# Patient Record
Sex: Male | Born: 1955
Health system: Southern US, Community
[De-identification: ages and names within clinical notes are randomized; demographics above are authoritative.]

## PROBLEM LIST (undated history)

## (undated) DIAGNOSIS — E785 Hyperlipidemia, unspecified: Secondary | ICD-10-CM

## (undated) HISTORY — DX: Hyperlipidemia, unspecified: E78.5

## (undated) HISTORY — PX: APPENDECTOMY: SHX54

---

## 1999-10-25 ENCOUNTER — Encounter: Payer: Self-pay | Admitting: Surgery

## 1999-10-25 ENCOUNTER — Encounter: Admission: RE | Admit: 1999-10-25 | Discharge: 1999-10-25 | Payer: Self-pay | Admitting: Surgery

## 2000-05-29 ENCOUNTER — Encounter: Payer: Self-pay | Admitting: General Surgery

## 2000-05-29 ENCOUNTER — Inpatient Hospital Stay (HOSPITAL_COMMUNITY): Admission: EM | Admit: 2000-05-29 | Discharge: 2000-06-12 | Payer: Self-pay | Admitting: Emergency Medicine

## 2000-05-29 ENCOUNTER — Encounter: Payer: Self-pay | Admitting: Emergency Medicine

## 2000-05-30 ENCOUNTER — Encounter: Payer: Self-pay | Admitting: General Surgery

## 2000-06-25 ENCOUNTER — Emergency Department (HOSPITAL_COMMUNITY): Admission: EM | Admit: 2000-06-25 | Discharge: 2000-06-25 | Payer: Self-pay | Admitting: Emergency Medicine

## 2016-12-20 ENCOUNTER — Encounter (HOSPITAL_COMMUNITY): Payer: Self-pay | Admitting: Emergency Medicine

## 2016-12-20 ENCOUNTER — Ambulatory Visit (HOSPITAL_COMMUNITY)
Admission: EM | Admit: 2016-12-20 | Discharge: 2016-12-20 | Disposition: A | Discharge status: Home / Self Care | Payer: BLUE CROSS/BLUE SHIELD | Attending: Family Medicine | Admitting: Family Medicine

## 2016-12-20 DIAGNOSIS — M79672 Pain in left foot: Secondary | ICD-10-CM

## 2016-12-20 DIAGNOSIS — M79671 Pain in right foot: Secondary | ICD-10-CM

## 2016-12-20 MED ORDER — DICLOFENAC SODIUM 75 MG PO TBEC: 75.0000 mg | DELAYED_RELEASE_TABLET | Freq: Two times a day (BID) | ORAL | 0 refills | Status: DC

## 2016-12-20 NOTE — ED Triage Notes (Signed)
Pt. Stated, 4 weeks ago my left foot started hurting right at the foot and ankle come together, now its on both feet.

## 2016-12-20 NOTE — ED Provider Notes (Signed)
CSN: [id number]     Arrival date & time 12/20/16  1711 History   None    Chief Complaint  Patient presents with  . Foot Pain   (Consider location/radiation/quality/duration/timing/severity/associated sxs/prior Treatment) 61 year old male presents to clinic with chief complaint of bilateral foot pain. His pain in the left foot started 4 weeks ago and the pain in the right started 2 weeks ago. He states the pain is at the "top" of his foot, dull and achy, worse when bearing weight. Denies history of trauma, grout, or neuropathy.   The history is provided by the patient.  Foot Pain     History reviewed. No pertinent past medical history. History reviewed. No pertinent surgical history. No family history on file. Social History  Substance Use Topics  . Smoking status: Never Smoker  . Smokeless tobacco: Never Used  . Alcohol use No    Review of Systems  Reason unable to perform ROS: as covered in HPI.  All other systems reviewed and are negative.   Allergies  Patient has no allergy information on record.  Home Medications   Prior to Admission medications   Medication Sig Start Date End Date Taking? Authorizing Provider  diclofenac (VOLTAREN) 75 MG EC tablet Take 1 tablet (75 mg total) by mouth 2 (two) times daily. 12/20/16   Barnet Glasgow, NP   Meds Ordered and Administered this Visit  Medications - No data to display  BP 135/72 (BP Location: Right Arm)   Pulse 70   Temp 98.5 F (36.9 C) (Oral)   Resp 17   Ht 6' 2.5" (1.892 m)   Wt 240 lb (108.9 kg)   SpO2 99%   BMI 30.40 kg/m  No data found.   Physical Exam  Constitutional: He is oriented to person, place, and time. He appears well-developed and well-nourished. No distress.  Musculoskeletal:       Right ankle: He exhibits normal range of motion, no swelling and normal pulse. Tenderness.       Feet:  Neurological: He is alert and oriented to person, place, and time.  Skin: Skin is warm and dry. Capillary  refill takes less than 2 seconds. He is not diaphoretic.  Psychiatric: He has a normal mood and affect.  Nursing note and vitals reviewed.   Urgent Care Course     Procedures (including critical care time)  Labs Review Labs Reviewed - No data to display  Imaging Review No results found.   Visual Acuity Review  Right Eye Distance:   Left Eye Distance:   Bilateral Distance:    Right Eye Near:   Left Eye Near:    Bilateral Near:         MDM   1. Pain in both feet   For your pain, the symptoms are consistent with an over use injury such as tendonitis. I have prescribed an antiinflammatory medication called Diclofenac. Take this medicine twice a day. I advise rest, elevation, and ice for 15 minutes at a time 4 or more times a day. Should your symptoms fail to improve, I would recommend following up with an orthopedic doctor for further evaluation.     Barnet Glasgow, NP 12/20/16 [phone]

## 2016-12-20 NOTE — Discharge Instructions (Signed)
For your pain, the symptoms are consistent with an over use injury such as tendonitis. I have prescribed an antiinflammatory medication called Diclofenac. Take this medicine twice a day. I advise rest, elevation, and ice for 15 minutes at a time 4 or more times a day. Should your symptoms fail to improve, I would recommend following up with an orthopedic doctor for further evaluation.

## 2019-08-01 ENCOUNTER — Ambulatory Visit
Admission: EM | Admit: 2019-08-01 | Discharge: 2019-08-01 | Disposition: A | Discharge status: Home / Self Care | Payer: BC Managed Care – PPO | Attending: Physician Assistant | Admitting: Physician Assistant

## 2019-08-01 DIAGNOSIS — G8929 Other chronic pain: Secondary | ICD-10-CM | POA: Diagnosis not present

## 2019-08-01 DIAGNOSIS — M25561 Pain in right knee: Secondary | ICD-10-CM

## 2019-08-01 DIAGNOSIS — M25562 Pain in left knee: Secondary | ICD-10-CM

## 2019-08-01 MED ORDER — DICLOFENAC SODIUM 1 % TD GEL: 2.0000 g | Freq: Four times a day (QID) | TRANSDERMAL | 0 refills | Status: DC

## 2019-08-01 MED ORDER — MELOXICAM 7.5 MG PO TABS: 7.5000 mg | ORAL_TABLET | Freq: Every day | ORAL | 0 refills | Status: DC

## 2019-08-01 NOTE — ED Triage Notes (Signed)
Pt c/o bilateral knee pain for the past 3 1/2 months. States now they pop and get stiff.

## 2019-08-01 NOTE — Discharge Instructions (Signed)
Start Mobic. Do not take ibuprofen (motrin/advil)/ naproxen (aleve) while on mobic. You can use voltaren gel/tylenol as needed for additional pain relief. Ice compress, rest, knee sleeve during activity. If continues, follow up with sports medicine/orthopedics for further evaluation needed.

## 2019-08-01 NOTE — ED Provider Notes (Signed)
[facility]    CSN: [id number] Arrival date & time: 08/01/19  1109      History   Chief Complaint Chief Complaint  Patient presents with  . Knee Pain    HPI [facility] is a 63 y.o. male.   63 yo presents with bilateral knee pain x 3-4 months. He reports twisting his right knee 3-4 months ago and then twisting his left knee shortly after. He did not have knee pain prior to these injuries. He reports swelling following the injuries which has resolved. He now has intermittent pain. The pain generalized on the patellar aspect of both knees. He reports the pain was more significant yesterday with the rain. He reports stiffness in the morning that improves throughout the day and throbbing at rest. The pain is also worse when raising from a seated to standing position. He has tried aspirin, Bengay and heat with moderate relief.  Denies swelling, erythema, or fever. Denies numbness or tinging. He previous did work that required him to be on his knees frequently.      History reviewed. No pertinent past medical history.  There are no active problems to display for this patient.   Past Surgical History:  Procedure Laterality Date  . APPENDECTOMY         Home Medications    Prior to Admission medications   Medication Sig Start Date End Date Taking? Authorizing Provider  diclofenac sodium (VOLTAREN) 1 % GEL Apply 2 g topically 4 (four) times daily. 08/01/19   Tasia Catchings, Ardean Simonich V, PA-C  meloxicam (MOBIC) 7.5 MG tablet Take 1 tablet (7.5 mg total) by mouth daily. 08/01/19   Ok Edwards, PA-C    Family History No family history on file.  Social History Social History   Tobacco Use  . Smoking status: Never Smoker  . Smokeless tobacco: Never Used  Substance Use Topics  . Alcohol use: No  . Drug use: No     Allergies   Patient has no known allergies.   Review of Systems Review of Systems  See HPI.    Physical Exam Triage Vital Signs ED Triage Vitals  Enc Vitals  Group     BP 08/01/19 1124 140/85     Pulse Rate 08/01/19 1124 69     Resp 08/01/19 1124 18     Temp 08/01/19 1124 98.3 F (36.8 C)     Temp Source 08/01/19 1124 Oral     SpO2 08/01/19 1124 98 %     Weight --      Height --      Head Circumference --      Peak Flow --      Pain Score 08/01/19 1125 3     Pain Loc --      Pain Edu? --      Excl. in [place]? --    No data found.  Updated Vital Signs BP 140/85 (BP Location: Left Arm)   Pulse 69   Temp 98.3 F (36.8 C) (Oral)   Resp 18   SpO2 98%   Physical Exam Constitutional:      General: He is not in acute distress.    Appearance: Normal appearance. He is not ill-appearing, toxic-appearing or diaphoretic.  HENT:     Head: Normocephalic and atraumatic.  Pulmonary:     Effort: Pulmonary effort is normal. No respiratory distress.  Musculoskeletal:     Right knee: He exhibits normal range of motion, no swelling, no effusion, no deformity,  no erythema, normal alignment, no LCL laxity, normal patellar mobility, no bony tenderness and no MCL laxity. No tenderness found. No medial joint line, no lateral joint line, no MCL, no LCL and no patellar tendon tenderness noted.     Left knee: He exhibits normal range of motion, no swelling, no effusion, no deformity, no erythema, normal alignment, no LCL laxity, normal patellar mobility, no bony tenderness, normal meniscus and no MCL laxity. No tenderness found. No medial joint line, no lateral joint line, no MCL, no LCL and no patellar tendon tenderness noted.     Comments: Right knee crepitus noted. No crepitus noted of left knee.   Negative anterior/posterior drawer. No tenderness with varus/valgus stress  Skin:    General: Skin is warm and dry.  Neurological:     Mental Status: He is alert and oriented to person, place, and time. Mental status is at baseline.  Psychiatric:        Mood and Affect: Mood normal.        Behavior: Behavior normal.      UC Treatments / Results  Labs  (all labs ordered are listed, but only abnormal results are displayed) Labs Reviewed - No data to display  EKG   Radiology No results found.  Procedures Procedures (including critical care time)  Medications Ordered in UC Medications - No data to display  Initial Impression / Assessment and Plan / UC Course  I have reviewed the triage vital signs and the nursing notes.  Pertinent labs & imaging results that were available during my care of the patient were reviewed by me and considered in my medical decision making (see chart for details).   No indications for Xray today. Lower suspicion for septic arthritis based on history and exam. Mobic and voltaren gel given. Knee sleeves fitted today. Return precautions given. Discussed follow up with sports medicine or ortho for further evaluation if no improvement.    Final Clinical Impressions(s) / UC Diagnoses   Final diagnoses:  Chronic pain of both knees    ED Prescriptions    Medication Sig Dispense Auth. Provider   meloxicam (MOBIC) 7.5 MG tablet Take 1 tablet (7.5 mg total) by mouth daily. 10 tablet Tasia Catchings, Sirius Woodford V, PA-C   diclofenac sodium (VOLTAREN) 1 % GEL Apply 2 g topically 4 (four) times daily. 50 g Ok Edwards, PA-C     PDMP not reviewed this encounter.   Ok Edwards, PA-C 08/01/19 1457

## 2019-08-20 ENCOUNTER — Encounter: Payer: Self-pay | Admitting: Sports Medicine

## 2019-08-20 ENCOUNTER — Ambulatory Visit
Admission: RE | Admit: 2019-08-20 | Discharge: 2019-08-20 | Disposition: A | Discharge status: Home / Self Care | Payer: BC Managed Care – PPO | Source: Ambulatory Visit | Attending: Sports Medicine | Admitting: Sports Medicine

## 2019-08-20 ENCOUNTER — Other Ambulatory Visit: Payer: Self-pay

## 2019-08-20 ENCOUNTER — Ambulatory Visit: Payer: BC Managed Care – PPO | Admitting: Sports Medicine

## 2019-08-20 VITALS — BP 130/82 | Ht 74.5 in | Wt 220.0 lb

## 2019-08-20 DIAGNOSIS — G8929 Other chronic pain: Secondary | ICD-10-CM

## 2019-08-20 DIAGNOSIS — M25562 Pain in left knee: Secondary | ICD-10-CM | POA: Diagnosis not present

## 2019-08-20 DIAGNOSIS — M25561 Pain in right knee: Secondary | ICD-10-CM | POA: Diagnosis not present

## 2019-08-20 DIAGNOSIS — M25462 Effusion, left knee: Secondary | ICD-10-CM | POA: Diagnosis not present

## 2019-08-20 DIAGNOSIS — M25461 Effusion, right knee: Secondary | ICD-10-CM | POA: Diagnosis not present

## 2019-08-20 NOTE — Progress Notes (Addendum)
[facility] [address] [place], [place] [address] Phone: [phone] Fax: [phone]   Patient Name: [facility] Date of Birth: 1955-12-31 Medical Record Number: [id number] Gender: male Date of Encounter: 08/20/2019  CC: Bilateral knee pain  HPI: [facility] is a 63 year old gentleman presenting with bilateral knee pain.  He says it has been bothering him for years, but over the last 18 months has worsened.  He was seen at an urgent care last month and given knee braces, Mobic, and Voltaren gel that has helped.  He did have some GI upset with the meloxicam.  States the pain is not the most bothersome, but it is more the locking, catching, and instability he is feeling.  Feels it started in his left knee, but favored it so much that the right knee is starting to ache more.  He denies any radiating pain.  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  Most of his jobs in the past have involved a lot of being on his knees as well.  He is a regular bowler and has not been able to bowl during quarantine.  No past medical history on file.  Current Outpatient Medications on File Prior to Visit  Medication Sig Dispense Refill  . diclofenac sodium (VOLTAREN) 1 % GEL Apply 2 g topically 4 (four) times daily. 50 g 0  . meloxicam (MOBIC) 7.5 MG tablet Take 1 tablet (7.5 mg total) by mouth daily. 10 tablet 0   No current facility-administered medications on file prior to visit.     Past Surgical History:  Procedure Laterality Date  . APPENDECTOMY      No Known Allergies  Social History   Socioeconomic History  . Marital status: Married    Spouse name: Not on file  . Number of children: Not on file  . Years of education: Not on file  . Highest education level: Not on file  Occupational History  . Not on file  Social Needs  . Financial resource strain: Not on file  . Food insecurity   Worry: Not on file    Inability: Not on file  . Transportation needs    Medical: Not on file    Non-medical: Not on file  Tobacco Use  . Smoking status: Never Smoker  . Smokeless tobacco: Never Used  Substance and Sexual Activity  . Alcohol use: No  . Drug use: No  . Sexual activity: Not on file  Lifestyle  . Physical activity    Days per week: Not on file    Minutes per session: Not on file  . Stress: Not on file  Relationships  . Social Herbalist on phone: Not on file    Gets together: Not on file    Attends religious service: Not on file    Active member of club or organization: Not on file    Attends meetings of clubs or organizations: Not on file    Relationship status: Not on file  . Intimate partner violence    Fear of current or ex partner: Not on file    Emotionally abused: Not on file    Physically abused: Not on file    Forced sexual activity: Not on file  Other Topics Concern  . Not on file  Social History Narrative  . Not on file    No family history on file.  BP  130/82   Ht 6' 2.5" (1.892 m)   Wt 220 lb (99.8 kg)   BMI 27.87 kg/m   ROS:  See HPI CONST: no F/C, no malaise, no fatigue MSK: See above NEURO: no numbness/tingling, no weakness SKIN: no rash, no lesions HEME: no bleeding, no bruising, no erythema  Objective: GEN: Alert and oriented, NAD Pulm: Breathing unlabored PSY: normal mood, congruent affect  Left knee: Normal to inspection with no erythema or obvious bony abnormalities. Small suprapatellar effusion Palpation normal with no warmth, joint line tenderness, patellar tenderness, or condyle tenderness. ROM full in flexion, but unable to reach terminal knee extension Ligaments with solid consistent endpoints including ACL, PCL, LCL, MCL. Positive Mcmurray's and Thessaly tests. Non painful patellar compression. Patellar glide without crepitus. Patellar and quadriceps tendons unremarkable. Hamstring and quadriceps  strength is normal.  Neurovascularly intact.  Right knee: Normal to inspection with no erythema or effusion or obvious bony abnormalities. Palpation normal with no warmth, joint line tenderness, patellar tenderness, or condyle tenderness. ROM full in flexion but unable to reach terminal knee extension Ligaments with solid consistent endpoints including ACL, PCL, LCL, MCL. Positive Mcmurray's and Thessaly tests. Non painful patellar compression. Patellar glide without crepitus. Patellar and quadriceps tendons unremarkable. Hamstring and quadriceps strength is normal.  Neurovascularly intact.  MSK left Limited knee ultrasound performed,  Suprapatellar pouch visualized in long and short axis with  small effusion Quadriceps tendon visualized in both long and short axis without abnormality. Patellar Tendon is visualized in long and short axis without abnormality, but did note thickening Medial meniscus and MCL visualized with no abnormality Lateral mensicus and LCL visualized with no abnormality  MSK right Limited knee ultrasound performed,  Suprapatellar pouch visualized in long and short axis with no abnormality. Quadriceps tendon visualized in both long and short axis without abnormality. Patellar Tendon is visualized in long and short axis without abnormality MCL visualized with no abnormality Medial meniscus visualized with degenerative changes Lateral mensicus and LCL visualized with no abnormality Popliteal fossa was visualized, no Baker's cyst   IMPRESSION:  Left knee demonstrates suprapatellar effusion.  Right knee demonstrates degenerative changes to medial meniscus.   Assessment and Plan:  1.  Bilateral knee pain  Given the chronicity of the symptoms and physical exam findings today, I have ordered bilateral knee XR to evaluate for degree of osteoarthritis.  I am concerned he likely has a degenerative meniscus tear.  I will call him back with results this week.  I have made  a follow-up appointment for 1 week to perform CSI.  I have also given patient a prescription to start physical therapy.  Continue to wear knee brace when active.  Continue to use Voltaren gel.   Lanier Clam, DO, ATC Sports Medicine Fellow  Addendum:  Patient seen in the office by fellow.  His history, exam, plan of care were precepted with me.  Karlton Lemon MD Kirt Boys

## 2019-08-27 ENCOUNTER — Encounter: Payer: Self-pay | Admitting: Sports Medicine

## 2019-08-27 ENCOUNTER — Other Ambulatory Visit: Payer: Self-pay

## 2019-08-27 ENCOUNTER — Ambulatory Visit: Payer: BC Managed Care – PPO | Admitting: Sports Medicine

## 2019-08-27 VITALS — BP 128/84 | Ht 74.5 in | Wt 220.0 lb

## 2019-08-27 DIAGNOSIS — M25562 Pain in left knee: Secondary | ICD-10-CM

## 2019-08-27 DIAGNOSIS — G8929 Other chronic pain: Secondary | ICD-10-CM | POA: Diagnosis not present

## 2019-08-27 DIAGNOSIS — M25561 Pain in right knee: Secondary | ICD-10-CM

## 2019-08-27 MED ORDER — METHYLPREDNISOLONE ACETATE 40 MG/ML IJ SUSP
40.0000 mg | Freq: Once | INTRAMUSCULAR | Status: AC
  Administered 2019-08-27: 09:00:00 40 mg via INTRA_ARTICULAR

## 2019-08-27 MED ORDER — METHYLPREDNISOLONE ACETATE 40 MG/ML IJ SUSP
40.0000 mg | Freq: Once | INTRAMUSCULAR | Status: AC
  Administered 2019-08-27: 40 mg via INTRA_ARTICULAR

## 2019-08-27 NOTE — Progress Notes (Addendum)
[facility] [address] [place], [place] [address] Phone: [phone] Fax: [phone]   Patient Name: [facility] Date of Birth: November 29, 1955 Medical Record Number: [id number] Gender: male Date of Encounter: 08/27/2019  CC: Bilateral knee injections  HPI: [facility] presents today for bilateral knee injections  No past medical history on file.  Current Outpatient Medications on File Prior to Visit  Medication Sig Dispense Refill  . diclofenac sodium (VOLTAREN) 1 % GEL Apply 2 g topically 4 (four) times daily. 50 g 0  . meloxicam (MOBIC) 7.5 MG tablet Take 1 tablet (7.5 mg total) by mouth daily. (Patient not taking: Reported on 08/27/2019) 10 tablet 0   No current facility-administered medications on file prior to visit.     Past Surgical History:  Procedure Laterality Date  . APPENDECTOMY      No Known Allergies  Social History   Socioeconomic History  . Marital status: Married    Spouse name: Not on file  . Number of children: Not on file  . Years of education: Not on file  . Highest education level: Not on file  Occupational History  . Not on file  Social Needs  . Financial resource strain: Not on file  . Food insecurity    Worry: Not on file    Inability: Not on file  . Transportation needs    Medical: Not on file    Non-medical: Not on file  Tobacco Use  . Smoking status: Never Smoker  . Smokeless tobacco: Never Used  Substance and Sexual Activity  . Alcohol use: No  . Drug use: No  . Sexual activity: Not on file  Lifestyle  . Physical activity    Days per week: Not on file    Minutes per session: Not on file  . Stress: Not on file  Relationships  . Social Herbalist on phone: Not on file    Gets together: Not on file    Attends religious service: Not on file    Active member of club or organization: Not on file    Attends meetings of clubs or organizations: Not on file    Relationship status: Not  on file  . Intimate partner violence    Fear of current or ex partner: Not on file    Emotionally abused: Not on file    Physically abused: Not on file    Forced sexual activity: Not on file  Other Topics Concern  . Not on file  Social History Narrative  . Not on file    No family history on file.  BP 128/84   Ht 6' 2.5" (1.892 m)   Wt 220 lb (99.8 kg)   BMI 27.87 kg/m    Procedures After informed written consent timeout was performed, patient was seated on exam table. Right knee was prepped with alcohol swab and utilizing anterolateral approach, patient's right knee was injected intraarticularly with 3:1 bupivicaine: depomedrol. Patient tolerated the procedure well without immediate complications.  After informed written consent timeout was performed, patient was seated on exam table. Left knee was prepped with alcohol swab and utilizing anteromedial approach, patient's leftt knee was injected intraarticularly with 3:1 bupivicaine: depomedrol. Patient tolerated the procedure well without immediate complications.   Assessment and Plan:  1.  Bilateral knee osteoarthritis  Successful injections as above.  Patient was provided with HEP and OTC supplements.  Potential for repeating injections in 3 months versus gel injections in the future.  He will follow-up in 6 weeks or sooner if symptoms persist.   Lanier Clam, DO, ATC Sports Medicine Fellow  Addendum:  Patient seen in the office by fellow.  His history, exam, plan of care were precepted with me.  Clayton Lemon MD [facility]

## 2019-09-18 ENCOUNTER — Other Ambulatory Visit: Payer: Self-pay

## 2019-09-18 DIAGNOSIS — Z20822 Contact with and (suspected) exposure to covid-19: Secondary | ICD-10-CM

## 2019-09-19 LAB — NOVEL CORONAVIRUS, NAA: SARS-CoV-2, NAA: NOT DETECTED

## 2019-09-22 ENCOUNTER — Telehealth: Payer: Self-pay | Admitting: General Practice

## 2019-09-22 NOTE — Telephone Encounter (Signed)
Patient is calling to receive his negative COVID results. Patient expressed understanding. °

## 2021-04-19 ENCOUNTER — Ambulatory Visit (INDEPENDENT_AMBULATORY_CARE_PROVIDER_SITE_OTHER): Payer: BC Managed Care – PPO

## 2021-04-19 ENCOUNTER — Encounter: Payer: Self-pay | Admitting: Family Medicine

## 2021-04-19 ENCOUNTER — Other Ambulatory Visit: Payer: Self-pay

## 2021-04-19 ENCOUNTER — Ambulatory Visit: Payer: BC Managed Care – PPO | Admitting: Family Medicine

## 2021-04-19 ENCOUNTER — Ambulatory Visit: Payer: Self-pay

## 2021-04-19 VITALS — BP 120/78 | HR 79 | Ht 74.5 in | Wt 231.0 lb

## 2021-04-19 DIAGNOSIS — M25512 Pain in left shoulder: Secondary | ICD-10-CM

## 2021-04-19 DIAGNOSIS — G8929 Other chronic pain: Secondary | ICD-10-CM

## 2021-04-19 DIAGNOSIS — M11261 Other chondrocalcinosis, right knee: Secondary | ICD-10-CM | POA: Diagnosis not present

## 2021-04-19 DIAGNOSIS — M25561 Pain in right knee: Secondary | ICD-10-CM | POA: Diagnosis not present

## 2021-04-19 NOTE — Progress Notes (Signed)
Subjective:    CC: Bilateral knee pain, R >L  I, Clayton Kirby, LAT, ATC, am serving as scribe for Dr. Lynne Kirby.  HPI: Pt is a 65 y/o male complaining of bilat knee pain, R>L,  X 3 weeks w/ no new MOI. Pt was previously seen at [facility] in 2020 and received bilat knee steroid injections on 08/27/19. Pt locates pain to his R ant-med knee.  Knee swelling: No Mechanical symptoms: yes Aggravates: Driving; transitioning from sit-to-stand Treatments tried: OTC anti-inflammatories; knee brace; turmeric  Dx imaging: 08/20/19 R & L knee XR  Pt would also like to discuss his L shoulder that his bothering him x 3 months w/ no known MOI.  He notes that it does not hurt very much however he will occasionally get stuck in an abducted externally rotated position and wake from sleep.  This requires him to mechanically unlock his shoulder with some specific motion.  He is able to use his shoulder normally including bowling without a lot of dysfunction.  He is left-handed.   Pertinent review of Systems: No fevers or chills  Relevant historical information: History of multilevel spinal fusion.  Chondrocalcinosis  Objective:    Vitals:   04/19/21 1508  BP: 120/78  Pulse: 79  SpO2: 97%   General: Well Developed, well nourished, and in no acute distress.   MSK: Right knee mild effusion normal motion with crepitation.  Stable ligamentous exam intact strength.  Tender palpation medial joint line.  Left shoulder normal.  Nontender normal motion normal strength negative impingement testing.  Lab and Radiology Results  Procedure: Real-time Ultrasound Guided Injection of right knee superior lateral patellar space Device: Philips Affiniti 50G Images permanently stored and available for review in PACS Verbal informed consent obtained.  Discussed risks and benefits of procedure. Warned about infection bleeding damage to structures skin hypopigmentation and fat atrophy among  others. Patient expresses understanding and agreement Time-out conducted.   Noted no overlying erythema, induration, or other signs of local infection.   Skin prepped in a sterile fashion.   Local anesthesia: Topical Ethyl chloride.   With sterile technique and under real time ultrasound guidance:  40 mg of Kenalog and 2 mL of Marcaine injected into knee joint. Fluid seen entering the joint capsule.   Completed without difficulty   Pain immediately resolved suggesting accurate placement of the medication.   Advised to call if fevers/chills, erythema, induration, drainage, or persistent bleeding.   Images permanently stored and available for review in the ultrasound unit.  Impression: Technically successful ultrasound guided injection.   X-ray images left shoulder obtained today personally and independently interpreted Degenerative changes.  Multiple osteophytes left shoulder.  No acute fractures. Await formal radiology review   EXAM: RIGHT KNEE 3 VIEWS; LEFT KNEE 3 VIEWS  COMPARISON:  None.  FINDINGS: No fracture or dislocation of the bilateral knees. There is symmetric, moderate medial compartment joint space loss with minimal tricompartmental osteophytosis. There is bilateral chondrocalcinosis in keeping with CPPD. There are large, nonspecific bilateral knee joint effusions. There is extensive, symmetric enthesopathic change about the patellar poles and tibial tuberosities. Soft tissues are unremarkable.  IMPRESSION: 1. No fracture or dislocation of the bilateral knees.  2. There is symmetric, moderate medial compartment joint space loss with minimal tricompartmental osteophytosis.  3. There is bilateral chondrocalcinosis in keeping with CPPD (pseudogout).  4.  There are large, nonspecific bilateral knee joint effusions.  5. There is extensive, symmetric enthesopathic change about the patellar  poles and tibial tuberosities.   Electronically Signed   By:  Eddie Candle M.D.   On: 08/20/2019 15:12  I, Clayton Kirby, personally (independently) visualized and performed the interpretation of the images attached in this note.   Impression and Recommendations:    Assessment and Plan: 65 y.o. male with right knee pain.  Likely exacerbation of DJD.  Patient may have pseudogout based on chondrocalcinosis seen on x-ray 2020.  Plan for steroid injection today and Voltaren gel.  Recheck as needed.  Left shoulder mechanical locking.  This is been ongoing for about 3 months.  Patient is not particularly bothered by his symptoms.  I think the locking is probably due to the osteophyte seen on x-ray.  Ultimately the solution to his symptoms probably would require a total shoulder replacement or may be subacromial decompression and distal clavicle excision.  However because he is not bothered by his symptoms all that much and I do not think we really need to do very much at all aside from reassure him and proceed with some careful watchful waiting and perhaps home exercise program or physical therapy.Marland Kitchen  He is happy with this plan.  PDMP not reviewed this encounter. Orders Placed This Encounter  Procedures  . [place] LIMITED JOINT SPACE STRUCTURES LOW RIGHT(NO LINKED CHARGES)    Order Specific Question:   Reason for Exam (SYMPTOM  OR DIAGNOSIS REQUIRED)    Answer:   R knee pain    Order Specific Question:   Preferred imaging location?    Answer:   [place]  . DG Shoulder Left    Standing Status:   Future    Number of Occurrences:   1    Standing Expiration Date:   04/19/2022    Order Specific Question:   Reason for Exam (SYMPTOM  OR DIAGNOSIS REQUIRED)    Answer:   eval left shoulder locking    Order Specific Question:   Preferred imaging location?    Answer:   [facility]   No orders of the defined types were placed in this encounter.   Discussed warning signs or symptoms. Please see discharge instructions. Patient expresses  understanding.   The above documentation has been reviewed and is accurate and complete Clayton Kirby, M.D.

## 2021-04-19 NOTE — Patient Instructions (Signed)
Thank you for coming in today.  Call or go to the ER if you develop a large red swollen joint with extreme pain or oozing puss.   Please use Voltaren gel (Generic Diclofenac Gel) up to 4x daily for pain as needed.  This is available over-the-counter as both the name brand Voltaren gel and the generic diclofenac gel.  Please get an Xray today before you leave  Recheck with me as needed.

## 2021-04-21 NOTE — Progress Notes (Signed)
Left shoulder x-ray shows medium arthritis

## 2021-04-25 NOTE — Progress Notes (Signed)
Pt viewed Dr. Corey's result note via MyChart.  

## 2021-07-13 ENCOUNTER — Ambulatory Visit: Payer: BC Managed Care – PPO | Admitting: Family Medicine

## 2021-07-13 ENCOUNTER — Ambulatory Visit: Payer: Self-pay

## 2021-07-13 ENCOUNTER — Other Ambulatory Visit: Payer: Self-pay

## 2021-07-13 VITALS — BP 122/76 | HR 69 | Ht 74.5 in | Wt 232.2 lb

## 2021-07-13 DIAGNOSIS — G8929 Other chronic pain: Secondary | ICD-10-CM

## 2021-07-13 DIAGNOSIS — M25512 Pain in left shoulder: Secondary | ICD-10-CM

## 2021-07-13 NOTE — Progress Notes (Signed)
I, Peterson Lombard, LAT, ATC acting as a scribe for Lynne Leader, MD.  Clayton Kirby is a left-hand-dominant 65 y.o. male who presents to [place] at [facility] today for continued chronic L shoulder pain. Pt is L-hand dominate. Pt was last seen by Dr. Georgina Snell on 04/19/21 and was advised to plan on watchful waiting and the pain was not super bothersome at the time. Today, pt reports L shoulder flared up and worsened over the past 2 weeks. Pt locates pain to superior and lateral aspect of shoulder. Pt feels like the shoulder gets "locked" in place.  Mechanical symptoms: no UE numbness/tingling: no UE weakness: no Aggravates: ABD, overhead, ER Treatments tried: creams  Dx imaging: 04/19/21 L shoulder XR  Pertinent review of systems: No fevers or chills  Relevant historical information: History of chondrocalcinosis of the knee.   Exam:  BP 122/76   Pulse 69   Ht 6' 2.5" (1.892 m)   Wt 232 lb 3.2 oz (105.3 kg)   SpO2 97%   BMI 29.41 kg/m  General: Well Developed, well nourished, and in no acute distress.   MSK: Left shoulder normal-appearing Nontender. Range of motion abduction 100 degrees. Internal rotation lumbar spine. External rotation full. Strength 4+/5 abduction 5/5 external and internal rotation. Positive Hawkins and Neer's test. Negative Yergason's and speeds test. Pulses cap refill and sensation are intact distally.    Lab and Radiology Results  Procedure: Real-time Ultrasound Guided Injection of left shoulder subacromial bursa Device: Philips Affiniti 50G Images permanently stored and available for review in PACS Ultrasound evaluation prior to injection reveals intact rotator cuff tendons with moderate subacromial bursitis. Verbal informed consent obtained.  Discussed risks and benefits of procedure. Warned about infection bleeding damage to structures skin hypopigmentation and fat atrophy among others. Patient expresses understanding and  agreement Time-out conducted.   Noted no overlying erythema, induration, or other signs of local infection.   Skin prepped in a sterile fashion.   Local anesthesia: Topical Ethyl chloride.   With sterile technique and under real time ultrasound guidance: 40 mg of Kenalog and 2 mL of Marcaine injected into subacromial bursa fluid seen entering the bursa.   Completed without difficulty   Pain immediately resolved suggesting accurate placement of the medication.   Advised to call if fevers/chills, erythema, induration, drainage, or persistent bleeding.   Images permanently stored and available for review in the ultrasound unit.  Impression: Technically successful ultrasound guided injection.     Assessment and Plan: 65 y.o. male with left shoulder pain thought to be due to subacromial bursitis.  Plan for steroid injection subacromial bursa and referral to physical therapy.  Reviewed home exercise program in clinic today prior to discharge.  Recheck back in about 6 weeks.  Return sooner if needed.  Precautions reviewed.   PDMP not reviewed this encounter. Orders Placed This Encounter  Procedures   [place] LIMITED JOINT SPACE STRUCTURES UP LEFT(NO LINKED CHARGES)    Standing Status:   Future    Number of Occurrences:   1    Standing Expiration Date:   01/10/2022    Order Specific Question:   Reason for Exam (SYMPTOM  OR DIAGNOSIS REQUIRED)    Answer:   left shoulder pain    Order Specific Question:   Preferred imaging location?    Answer:   [place]   Ambulatory referral to Physical Therapy    Referral Priority:   Routine    Referral Type:  Physical Medicine    Referral Reason:   Specialty Services Required    Requested Specialty:   Physical Therapy    Number of Visits Requested:   1   No orders of the defined types were placed in this encounter.    Discussed warning signs or symptoms. Please see discharge instructions. Patient expresses  understanding.   .[email or web address]

## 2021-07-13 NOTE — Patient Instructions (Addendum)
Thank you for coming in today.   I've referred you to Physical Therapy.  Let us know if you don't hear from them in one week.   Call or go to the ER if you develop a large red swollen joint with extreme pain or oozing puss.    Recheck as needed or in 6 weeks.

## 2021-08-03 ENCOUNTER — Ambulatory Visit: Payer: BC Managed Care – PPO | Attending: Physical Therapy | Admitting: Physical Therapy

## 2021-08-11 ENCOUNTER — Ambulatory Visit: Payer: BC Managed Care – PPO

## 2021-08-11 NOTE — Progress Notes (Signed)
Subjective:    Clayton Kirby - 65 y.o. male MRN [id number]  Date of birth: 1956-07-13  HPI  Clayton Kirby is to establish care and annual physical exam.   Current issues and/or concerns: None   ROS per HPI   Health Maintenance:  Health Maintenance Due  Topic Date Due   Hepatitis C Screening  Never done   COLONOSCOPY (Pts 45-38yr Insurance coverage will need to be confirmed)  Never done     Past Medical History: Patient Active Problem List   Diagnosis Date Noted   Chondrocalcinosis of right knee 04/19/2021    Social History   reports that he has never smoked. He has never used smokeless tobacco. He reports that he does not drink alcohol and does not use drugs.   Family History  family history is not on file.   Medications: reviewed and updated   Objective:   Physical Exam BP 132/80 (BP Location: Left Arm, Patient Position: Sitting, Cuff Size: Normal)   Pulse 61   Temp 98.3 F (36.8 C)   Resp 16   Ht 6' 2.49" (1.892 m)   Wt 228 lb 9.6 oz (103.7 kg)   SpO2 98%   BMI 28.97 kg/m   Physical Exam HENT:     Head: Normocephalic and atraumatic.     Right Ear: Tympanic membrane, ear canal and external ear normal.     Left Ear: Tympanic membrane and external ear normal.     Nose: Nose normal.     Mouth/Throat:     Mouth: Mucous membranes are moist.  Eyes:     Extraocular Movements: Extraocular movements intact.     Conjunctiva/sclera: Conjunctivae normal.     Pupils: Pupils are equal, round, and reactive to light.  Cardiovascular:     Rate and Rhythm: Normal rate and regular rhythm.     Pulses: Normal pulses.     Heart sounds: Normal heart sounds.  Pulmonary:     Effort: Pulmonary effort is normal.     Breath sounds: Normal breath sounds.  Abdominal:     General: Bowel sounds are normal.     Palpations: Abdomen is soft.  Genitourinary:    Comments: Patient declined exam.  Musculoskeletal:        General: Normal range of motion.     Cervical back:  Normal range of motion and neck supple.  Skin:    General: Skin is warm and dry.     Capillary Refill: Capillary refill takes less than 2 seconds.  Neurological:     General: No focal deficit present.     Mental Status: He is alert and oriented to person, place, and time.  Psychiatric:        Mood and Affect: Mood normal.        Behavior: Behavior normal.     Assessment & Plan:  1. Encounter to establish care: 2. Annual physical exam: - Counseled on 150 minutes of exercise per week as tolerated, healthy eating (including decreased daily intake of saturated fats, cholesterol, added sugars, sodium), STI prevention, and routine healthcare maintenance.  3. Screening for metabolic disorder: - CAJG81+LXBWto check kidney function, liver function, and electrolyte balance.  - CMP14+EGFR  4. Screening for deficiency anemia: - CBC to screen for anemia. - CBC  5. Diabetes mellitus screening: - Hemoglobin A1c to screen for pre-diabetes/diabetes. - Hemoglobin A1c  6. Screening cholesterol level: - Lipid panel to screen for high cholesterol.  - Lipid panel  7. Thyroid disorder screen: -  TSH to check thyroid function.  - TSH  8. Need for hepatitis C screening test: - Hepatitis C antibody to screen for hepatitis C.  - Hepatitis C Antibody  9. Colon cancer screening: - Referral to Gastroenterology for colon cancer screening by colonoscopy. - Ambulatory referral to Gastroenterology    Patient was given clear instructions to go to Emergency Department or return to medical center if symptoms don't improve, worsen, or new problems develop.The patient verbalized understanding.  I discussed the assessment and treatment plan with the patient. The patient was provided an opportunity to ask questions and all were answered. The patient agreed with the plan and demonstrated an understanding of the instructions.   The patient was advised to call back or seek an in-person evaluation if the  symptoms worsen or if the condition fails to improve as anticipated.    Durene Fruits, NP 08/15/2021, 5:03 PM Primary Care at [facility]

## 2021-08-15 ENCOUNTER — Ambulatory Visit (INDEPENDENT_AMBULATORY_CARE_PROVIDER_SITE_OTHER): Payer: BC Managed Care – PPO | Admitting: Family

## 2021-08-15 ENCOUNTER — Other Ambulatory Visit: Payer: Self-pay

## 2021-08-15 ENCOUNTER — Encounter: Payer: Self-pay | Admitting: Family

## 2021-08-15 VITALS — BP 132/80 | HR 61 | Temp 98.3°F | Resp 16 | Ht 74.49 in | Wt 228.6 lb

## 2021-08-15 DIAGNOSIS — Z1159 Encounter for screening for other viral diseases: Secondary | ICD-10-CM

## 2021-08-15 DIAGNOSIS — Z1329 Encounter for screening for other suspected endocrine disorder: Secondary | ICD-10-CM

## 2021-08-15 DIAGNOSIS — Z7689 Persons encountering health services in other specified circumstances: Secondary | ICD-10-CM | POA: Diagnosis not present

## 2021-08-15 DIAGNOSIS — Z130 Encounter for screening for diseases of the blood and blood-forming organs and certain disorders involving the immune mechanism: Secondary | ICD-10-CM

## 2021-08-15 DIAGNOSIS — Z131 Encounter for screening for diabetes mellitus: Secondary | ICD-10-CM

## 2021-08-15 DIAGNOSIS — Z13228 Encounter for screening for other metabolic disorders: Secondary | ICD-10-CM

## 2021-08-15 DIAGNOSIS — Z0000 Encounter for general adult medical examination without abnormal findings: Secondary | ICD-10-CM

## 2021-08-15 DIAGNOSIS — Z13220 Encounter for screening for lipoid disorders: Secondary | ICD-10-CM

## 2021-08-15 DIAGNOSIS — Z1211 Encounter for screening for malignant neoplasm of colon: Secondary | ICD-10-CM

## 2021-08-15 NOTE — Patient Instructions (Signed)
Preventive Care 66 Years and Older, Male Preventive care refers to lifestyle choices and visits with your health care provider that can promote health and wellness. This includes: A yearly physical exam. This is also called an annual wellness visit. Regular dental and eye exams. Immunizations. Screening for certain conditions. Healthy lifestyle choices, such as: Eating a healthy diet. Getting regular exercise. Not using drugs or products that contain nicotine and tobacco. Limiting alcohol use. What can I expect for my preventive care visit? Physical exam Your health care provider will check your: Height and weight. These may be used to calculate your BMI (body mass index). BMI is a measurement that tells if you are at a healthy weight. Heart rate and blood pressure. Body temperature. Skin for abnormal spots. Counseling Your health care provider may ask you questions about your: Past medical problems. Family's medical history. Alcohol, tobacco, and drug use. Emotional well-being. Home life and relationship well-being. Sexual activity. Diet, exercise, and sleep habits. History of falls. Memory and ability to understand (cognition). Work and work Statistician. Access to firearms. What immunizations do I need? Vaccines are usually given at various ages, according to a schedule. Your health care provider will recommend vaccines for you based on your age, medical history, and lifestyle or other factors, such as travel or where you work. What tests do I need? Blood tests Lipid and cholesterol levels. These may be checked every 5 years, or more often depending on your overall health. Hepatitis C test. Hepatitis B test. Screening Lung cancer screening. You may have this screening every year starting at age 14 if you have a 30-pack-year history of smoking and currently smoke or have quit within the past 15 years. Colorectal cancer screening. All adults should have this screening  starting at age 29 and continuing until age 69. Your health care provider may recommend screening at age 33 if you are at increased risk. You will have tests every 1-10 years, depending on your results and the type of screening test. Prostate cancer screening. Recommendations will vary depending on your family history and other risks. Genital exam to check for testicular cancer or hernias. Diabetes screening. This is done by checking your blood sugar (glucose) after you have not eaten for a while (fasting). You may have this done every 1-3 years. Abdominal aortic aneurysm (AAA) screening. You may need this if you are a current or former smoker. STD (sexually transmitted disease) testing, if you are at risk. Follow these instructions at home: Eating and drinking  Eat a diet that includes fresh fruits and vegetables, whole grains, lean protein, and low-fat dairy products. Limit your intake of foods with high amounts of sugar, saturated fats, and salt. Take vitamin and mineral supplements as recommended by your health care provider. Do not drink alcohol if your health care provider tells you not to drink. If you drink alcohol: Limit how much you have to 0-2 drinks a day. Be aware of how much alcohol is in your drink. In the [place] one drink equals one 12 oz bottle of beer (355 mL), one 5 oz glass of wine (148 mL), or one 1 oz glass of hard liquor (44 mL). Lifestyle Take daily care of your teeth and gums. Brush your teeth every morning and night with fluoride toothpaste. Floss one time each day. Stay active. Exercise for at least 30 minutes 5 or more days each week. Do not use any products that contain nicotine or tobacco, such as cigarettes, e-cigarettes, and chewing tobacco. If  you need help quitting, ask your health care provider. Do not use drugs. If you are sexually active, practice safe sex. Use a condom or other form of protection to prevent STIs (sexually transmitted infections). Talk  with your health care provider about taking a low-dose aspirin or statin. Find healthy ways to cope with stress, such as: Meditation, yoga, or listening to music. Journaling. Talking to a trusted person. Spending time with friends and family. Safety Always wear your seat belt while driving or riding in a vehicle. Do not drive: If you have been drinking alcohol. Do not ride with someone who has been drinking. When you are tired or distracted. While texting. Wear a helmet and other protective equipment during sports activities. If you have firearms in your house, make sure you follow all gun safety procedures. What's next? Visit your health care provider once a year for an annual wellness visit. Ask your health care provider how often you should have your eyes and teeth checked. Stay up to date on all vaccines. This information is not intended to replace advice given to you by your health care provider. Make sure you discuss any questions you have with your health care provider. Document Revised: 01/07/2021 Document Reviewed: 10/24/2018 Elsevier Patient Education  2022 [employer].

## 2021-08-15 NOTE — Progress Notes (Signed)
Pt presents to establish care, pt reports that he came for physical he has been fasting

## 2021-08-16 ENCOUNTER — Encounter: Payer: Self-pay | Admitting: Gastroenterology

## 2021-08-16 ENCOUNTER — Other Ambulatory Visit: Payer: Self-pay | Admitting: Family

## 2021-08-16 DIAGNOSIS — E785 Hyperlipidemia, unspecified: Secondary | ICD-10-CM | POA: Insufficient documentation

## 2021-08-16 LAB — HEMOGLOBIN A1C
Est. average glucose Bld gHb Est-mCnc: 91 mg/dL
Hgb A1c MFr Bld: 4.8 % (ref 4.8–5.6)

## 2021-08-16 LAB — LIPID PANEL
Chol/HDL Ratio: 2.5 ratio (ref 0.0–5.0)
Cholesterol, Total: 230 mg/dL — ABNORMAL HIGH (ref 100–199)
HDL: 92 mg/dL (ref >39)
LDL Chol Calc (NIH): 128 mg/dL — ABNORMAL HIGH (ref 0–99)
Triglycerides: 57 mg/dL (ref 0–149)
VLDL Cholesterol Cal: 10 mg/dL (ref 5–40)

## 2021-08-16 LAB — CMP14+EGFR
ALT: 15 IU/L (ref 0–44)
AST: 19 IU/L (ref 0–40)
Albumin/Globulin Ratio: 1.8 (ref 1.2–2.2)
Albumin: 4.6 g/dL (ref 3.8–4.8)
Alkaline Phosphatase: 58 IU/L (ref 44–121)
BUN/Creatinine Ratio: 10 (ref 10–24)
BUN: 11 mg/dL (ref 8–27)
Bilirubin Total: 1.3 mg/dL — ABNORMAL HIGH (ref 0.0–1.2)
CO2: 23 mmol/L (ref 20–29)
Calcium: 9.4 mg/dL (ref 8.6–10.2)
Chloride: 105 mmol/L (ref 96–106)
Creatinine, Ser: 1.08 mg/dL (ref 0.76–1.27)
Globulin, Total: 2.5 g/dL (ref 1.5–4.5)
Glucose: 78 mg/dL (ref 70–99)
Potassium: 4.1 mmol/L (ref 3.5–5.2)
Sodium: 142 mmol/L (ref 134–144)
Total Protein: 7.1 g/dL (ref 6.0–8.5)
eGFR: 76 mL/min/{1.73_m2} (ref >59)

## 2021-08-16 LAB — CBC
Hematocrit: 42.6 % (ref 37.5–51.0)
Hemoglobin: 14 g/dL (ref 13.0–17.7)
MCH: 26.9 pg (ref 26.6–33.0)
MCHC: 32.9 g/dL (ref 31.5–35.7)
MCV: 82 fL (ref 79–97)
Platelets: 247 10*3/uL (ref 150–450)
RBC: 5.2 x10E6/uL (ref 4.14–5.80)
RDW: 12.5 % (ref 11.6–15.4)
WBC: 4.7 10*3/uL (ref 3.4–10.8)

## 2021-08-16 LAB — HEPATITIS C ANTIBODY: Hep C Virus Ab: <0.1 s/co ratio (ref 0.0–0.9)

## 2021-08-16 LAB — TSH: TSH: 1.9 u[IU]/mL (ref 0.450–4.500)

## 2021-08-16 MED ORDER — ATORVASTATIN CALCIUM 20 MG PO TABS: 20.0000 mg | ORAL_TABLET | Freq: Every day | ORAL | 1 refills | Status: DC

## 2021-08-16 NOTE — Progress Notes (Signed)
Please call patient with update. This is per patient's request for call. Patient reports he does not use MyChart often.  Kidney function normal.   Liver function normal.   Thyroid function normal.   No diabetes.  No anemia.   Hepatitis C negative.   Cholesterol higher than expected. High cholesterol may increase risk of heart attack and/or stroke. Consider eating more fruits, vegetables, and lean baked meats such as chicken or fish. Moderate intensity exercise at least 150 minutes as tolerated per week may help as well.   Begin Atorvastatin (Lipitor) for high cholesterol. Please call our office and schedule to have cholesterol rechecked at lab only appointment in 6 to 6 weeks.   The following is for provider reference only:  The 10-year ASCVD risk score (Arnett DK, et al., 2019) is: 9.4%   Values used to calculate the score:     Age: 65 years     Sex: Male     Is Non-Hispanic African American: Yes     Diabetic: No     Tobacco smoker: No     Systolic Blood Pressure: 884 mmHg     Is BP treated: No     HDL Cholesterol: 92 mg/dL     Total Cholesterol: 230 mg/dL

## 2021-08-31 NOTE — Progress Notes (Signed)
   I, Wendy Poet, LAT, ATC, am serving as scribe for Dr. Lynne Leader.  Clayton Kirby is a 65 y.o. male who presents to [place] at [facility] today for f/u of chronic L shoulder pain.  He was last seen by Dr. Georgina Snell on 07/13/21 and had a L subacromial steroid injection.  He was also referred to PT at [facility] but has not completed any visits.  Today, pt reports L shoulder is 95% better. Pt had issues scheduling PT around his work schedule, but has been working on [employer].  Pt c/o "popping" over the L proximal clavicle, close to the [facility] joint. Pt c/o increased popping with certain motions and when laying down at night.  Diagnostic testing: L shoulder XR- 04/19/21  Pertinent review of systems: No fevers or chills  Relevant historical information: Hyperlipidemia and right knee chondrocalcinosis.   Exam:  BP 128/80   Pulse 67   Ht 6\' 2"  (1.88 m)   Wt 234 lb 9.6 oz (106.4 kg)   SpO2 96%   BMI 30.12 kg/m  General: Well Developed, well nourished, and in no acute distress.   MSK: Left shoulder normal-appearing normal motion. Normal palpable pop at left [place] joint with shoulder motion.    Lab and Radiology Results  X-ray images left clavicle obtained today personally and independently interpreted [place] joint difficult to visualize.  No obvious abnormality proximal clavicle.  Degenerative changes at proximal left first rib. Await formal radiology review    Assessment and Plan: 65 y.o. male with left [place] joint pop.  Thought to be degenerative changes although difficult to tell based on x-ray today.  Plan for watchful waiting and home exercise program as he is not having much pain.  If needed advanced imaging CT scan or MRI would be helpful.  Continue home exercise program for the shoulder pain as he is doing quite well with this.  Recheck back as needed.  Discussed precautions.   PDMP not reviewed this encounter. Orders Placed This Encounter  Procedures   DG Clavicle Left     Standing Status:   Future    Number of Occurrences:   1    Standing Expiration Date:   09/01/2022    Order Specific Question:   Reason for Exam (SYMPTOM  OR DIAGNOSIS REQUIRED)    Answer:   Left shoulder pain    Order Specific Question:   Preferred imaging location?    Answer:   [facility]   No orders of the defined types were placed in this encounter.    Discussed warning signs or symptoms. Please see discharge instructions. Patient expresses understanding.   The above documentation has been reviewed and is accurate and complete Lynne Leader, M.D.   Total encounter time 20 minutes including face-to-face time with the patient and, reviewing past medical record, and charting on the date of service.   Treatment plan and options and potential next steps

## 2021-09-01 ENCOUNTER — Ambulatory Visit: Payer: BC Managed Care – PPO | Admitting: Family Medicine

## 2021-09-01 ENCOUNTER — Ambulatory Visit (INDEPENDENT_AMBULATORY_CARE_PROVIDER_SITE_OTHER): Payer: BC Managed Care – PPO

## 2021-09-01 ENCOUNTER — Other Ambulatory Visit: Payer: Self-pay

## 2021-09-01 VITALS — BP 128/80 | HR 67 | Ht 74.0 in | Wt 234.6 lb

## 2021-09-01 DIAGNOSIS — G8929 Other chronic pain: Secondary | ICD-10-CM | POA: Diagnosis not present

## 2021-09-01 DIAGNOSIS — M25512 Pain in left shoulder: Secondary | ICD-10-CM

## 2021-09-01 NOTE — Patient Instructions (Addendum)
Thank you for coming in today.   Continue exercises at home.  Please get an Xray today before you leave   Recheck back with me as needed for this or any other musculoskeletal problems.

## 2021-09-05 NOTE — Progress Notes (Signed)
Clavicle x-ray does not show much abnormalities at the [facility] joint where we were looking.  There are some degenerative changes of the shoulder itself but we already knew that.

## 2021-09-23 ENCOUNTER — Other Ambulatory Visit: Payer: Self-pay | Admitting: *Deleted

## 2021-09-23 ENCOUNTER — Other Ambulatory Visit: Payer: BC Managed Care – PPO

## 2021-09-23 ENCOUNTER — Other Ambulatory Visit: Payer: Self-pay

## 2021-09-23 DIAGNOSIS — Z13220 Encounter for screening for lipoid disorders: Secondary | ICD-10-CM

## 2021-09-24 LAB — LIPID PANEL
Chol/HDL Ratio: 2 ratio (ref 0.0–5.0)
Cholesterol, Total: 156 mg/dL (ref 100–199)
HDL: 77 mg/dL (ref >39)
LDL Chol Calc (NIH): 69 mg/dL (ref 0–99)
Triglycerides: 44 mg/dL (ref 0–149)
VLDL Cholesterol Cal: 10 mg/dL (ref 5–40)

## 2021-09-24 NOTE — Progress Notes (Signed)
Cholesterol improved since 4 weeks ago. Continue Atorvastatin (Lipitor) for cholesterol maintenance.

## 2021-10-10 ENCOUNTER — Encounter: Payer: Self-pay | Admitting: Gastroenterology

## 2021-10-10 ENCOUNTER — Other Ambulatory Visit: Payer: Self-pay

## 2021-10-10 ENCOUNTER — Ambulatory Visit (AMBULATORY_SURGERY_CENTER): Payer: Self-pay | Admitting: *Deleted

## 2021-10-10 VITALS — Ht 74.0 in | Wt 235.0 lb

## 2021-10-10 DIAGNOSIS — Z1211 Encounter for screening for malignant neoplasm of colon: Secondary | ICD-10-CM

## 2021-10-10 MED ORDER — PEG 3350-KCL-NA BICARB-NACL 420 G PO SOLR: 4000.0000 mL | Freq: Once | ORAL | 0 refills | Status: AC

## 2021-10-10 NOTE — Progress Notes (Signed)
Patient is here in-person for PV. Patient denies any allergies to eggs or soy. Patient denies any problems with anesthesia/sedation. Patient is not on any oxygen at home. Patient is not taking any diet/weight loss medications or blood thinners. Patient is aware of our care-partner policy and Covid-19 safety protocol.   EMMI education assigned to the patient for the procedure, sent to MyChart.   Patient is COVID-19 vaccinated.  

## 2021-10-24 ENCOUNTER — Ambulatory Visit (AMBULATORY_SURGERY_CENTER): Payer: BC Managed Care – PPO | Admitting: Gastroenterology

## 2021-10-24 ENCOUNTER — Encounter: Payer: Self-pay | Admitting: Gastroenterology

## 2021-10-24 VITALS — BP 134/80 | HR 63 | Temp 96.9°F | Resp 17 | Ht 74.0 in | Wt 235.0 lb

## 2021-10-24 DIAGNOSIS — K635 Polyp of colon: Secondary | ICD-10-CM

## 2021-10-24 DIAGNOSIS — D123 Benign neoplasm of transverse colon: Secondary | ICD-10-CM

## 2021-10-24 DIAGNOSIS — Z1211 Encounter for screening for malignant neoplasm of colon: Secondary | ICD-10-CM

## 2021-10-24 DIAGNOSIS — K639 Disease of intestine, unspecified: Secondary | ICD-10-CM

## 2021-10-24 MED ORDER — SODIUM CHLORIDE 0.9 % IV SOLN: 500.0000 mL | INTRAVENOUS | Status: DC

## 2021-10-24 NOTE — Progress Notes (Signed)
LATE ENTRY:  Pt remained on monitor in PACU from 1025-1055 and felt fine, no further c/o of dizziness or nausea, VSS, Dr Candis Schatz examined pt and states he is ok for d/c.  Pt states he feels fine and ready to go.  Last set of VS : lying BP=134/80, HR=55, sitting BP = 136/78, HR=60, standing BP=140/80, HR = 60. Pt drank some apple juice and tolerated fine.  Pt and wife instructed to call if any concern, to go to ER or call 911 if any new sx that are unusual such as:  chest pain, dizziness or feeling of impending doom. Pt and wife verb understanding.

## 2021-10-24 NOTE — Progress Notes (Signed)
Called to room to assist during endoscopic procedure.  Patient ID and intended procedure confirmed with present staff. Received instructions for my participation in the procedure from the performing physician.  

## 2021-10-24 NOTE — Progress Notes (Signed)
Pt's states no medical or surgical changes since previsit or office visit. 

## 2021-10-24 NOTE — Progress Notes (Signed)
1024  Patient was bending over from standing position to put his shoes on and states he became dizzy.  He then states he knelt down d/t feeling dizzy and feeling as if he might fall.  RN at bedside and assisted patient back to bed.  Monitors reapplied.  CBG obtained and was 76, patient c/o nausea.  Alert and oriented , did not lose consciousness.  Obtaining EKG and Dr. Candis Schatz informed. SChaplin, RN,BSN

## 2021-10-24 NOTE — Progress Notes (Signed)
[facility] History and Physical   Primary Care Physician:  Camillia Herter, NP   Reason for Procedure:   Colon cancer screening  Plan:    Screening colonoscopy     HPI: Clayton Kirby is a 65 y.o. male undergoing initial average risk screening colonoscopy.  He has no family history of colon cancer and no chronic GI symptoms.    Past Medical History:  Diagnosis Date   Hyperlipidemia     Past Surgical History:  Procedure Laterality Date   APPENDECTOMY  2001    Prior to Admission medications   Medication Sig Start Date End Date Taking? Authorizing Provider  atorvastatin (LIPITOR) 20 MG tablet Take 1 tablet (20 mg total) by mouth daily. 08/16/21  Yes Camillia Herter, NP  Turmeric (QC TUMERIC COMPLEX PO) Take by mouth.   Yes [provider]    Current Outpatient Medications  Medication Sig Dispense Refill   atorvastatin (LIPITOR) 20 MG tablet Take 1 tablet (20 mg total) by mouth daily. 90 tablet 1   Turmeric (QC TUMERIC COMPLEX PO) Take by mouth.     Current Facility-Administered Medications  Medication Dose Route Frequency Provider Last Rate Last Admin   0.9 %  sodium chloride infusion  500 mL Intravenous Continuous Daryel November, MD        Allergies as of 10/24/2021   (No Known Allergies)    Family History  Problem Relation Age of Onset   Colon cancer Neg Hx    Esophageal cancer Neg Hx    Stomach cancer Neg Hx     Social History   Socioeconomic History   Marital status: Married    Spouse name: Not on file   Number of children: Not on file   Years of education: Not on file   Highest education level: Not on file  Occupational History   Not on file  Tobacco Use   Smoking status: Never   Smokeless tobacco: Never  Vaping Use   Vaping Use: Never used  Substance and Sexual Activity   Alcohol use: No   Drug use: No   Sexual activity: Not on file  Other Topics Concern   Not on file  Social History Narrative   Not on file    Social Determinants of Health   Financial Resource Strain: Not on file  Food Insecurity: Not on file  Transportation Needs: Not on file  Physical Activity: Not on file  Stress: Not on file  Social Connections: Not on file  Intimate Partner Violence: Not on file    Review of Systems:  All other review of systems negative except as mentioned in the HPI.  Physical Exam: Vital signs BP 129/72   Pulse 61   Temp (!) 96.9 F (36.1 C) (Temporal)   Ht 6\' 2"  (1.88 m)   Wt 235 lb (106.6 kg)   BMI 30.17 kg/m   General:   Alert,  Well-developed, well-nourished, pleasant and cooperative in NAD Airway:  Mallampati 2 Lungs:  Clear throughout to auscultation.   Heart:  Regular rate and rhythm; no murmurs, clicks, rubs,  or gallops. Abdomen:  Soft, nontender and nondistended. Normal bowel sounds.   Neuro/Psych:  Normal mood and affect. A and O x 3   Yarlin Breisch E. Candis Schatz, MD [facility]

## 2021-10-24 NOTE — Op Note (Signed)
[facility] Patient Name: Clayton Kirby Procedure Date: 10/24/2021 9:17 AM MRN: [id number] Endoscopist: Nicki Reaper E. Candis Schatz , MD Age: 65 Referring MD:  Date of Birth: [birth year] Gender: Male Account #: [id number] Procedure:                Colonoscopy Indications:              Screening for colorectal malignant neoplasm, This                            is the patient's first colonoscopy Medicines:                Monitored Anesthesia Care Procedure:                Pre-Anesthesia Assessment:                           - Prior to the procedure, a History and Physical                            was performed, and patient medications and                            allergies were reviewed. The patient's tolerance of                            previous anesthesia was also reviewed. The risks                            and benefits of the procedure and the sedation                            options and risks were discussed with the patient.                            All questions were answered, and informed consent                            was obtained. Prior Anticoagulants: The patient has                            taken no previous anticoagulant or antiplatelet                            agents. ASA Grade Assessment: II - A patient with                            mild systemic disease. After reviewing the risks                            and benefits, the patient was deemed in                            satisfactory condition to undergo the procedure.  After obtaining informed consent, the colonoscope                            was passed under direct vision. Throughout the                            procedure, the patient's blood pressure, pulse, and                            oxygen saturations were monitored continuously. The                            CF [id number] #[id number] was introduced through the anus                            and advanced to the  the ileocolonic anastomosis.                            The colonoscopy was performed without difficulty.                            The patient tolerated the procedure well. The                            quality of the bowel preparation was good. The                            ileocolonic anastomosis and neoterminal ileum were                            photographed. The bowel preparation used was                            GoLYTELY via split dose instruction. Scope In: 9:24:28 AM Scope Out: 9:39:26 AM Scope Withdrawal Time: 0 hours 11 minutes 46 seconds  Total Procedure Duration: 0 hours 14 minutes 58 seconds  Findings:                 The perianal and digital rectal examinations were                            normal. Pertinent negatives include normal                            sphincter tone and no palpable rectal lesions.                           There was evidence of a prior side-to-side                            ileo-colonic anastomosis in the ascending colon.                            This was patent and was characterized by healthy  appearing mucosa.                           A 10 mm polyp was found in the distal transverse                            colon. The polyp was pedunculated. The polyp was                            removed with a cold snare. Resection and retrieval                            were complete. Estimated blood loss was minimal.                           A few small-mouthed diverticula were found in the                            sigmoid colon.                           The exam was otherwise normal throughout the                            examined colon.                           The terminal ileum appeared normal.                           Non-bleeding internal hemorrhoids were found during                            retroflexion. The hemorrhoids were Grade I                            (internal hemorrhoids that do not  prolapse).                           No additional abnormalities were found on                            retroflexion.                           A few sessile polyps were found in the rectum. The                            polyps were diminutive in size. Complications:            No immediate complications. Estimated Blood Loss:     Estimated blood loss was minimal. Impression:               - Patent side-to-side ileo-colonic anastomosis,                            characterized  by healthy appearing mucosa.                           - One vermiform 10 mm polyp in the distal                            transverse colon, removed with a cold snare.                            Resected and retrieved. Suspect pseudopolyp                           - Diverticulosis in the sigmoid colon.                           - The examined portion of the ileum was normal.                           - Non-bleeding internal hemorrhoids.                           - A few diminutive polyps in the rectum, consistent                            with hyperplastic polyps. Recommendation:           - Patient has a contact number available for                            emergencies. The signs and symptoms of potential                            delayed complications were discussed with the                            patient. Return to normal activities tomorrow.                            Written discharge instructions were provided to the                            patient.                           - Resume previous diet.                           - Continue present medications.                           - Await pathology results.                           - Repeat colonoscopy (date not yet determined) for                            surveillance  based on pathology results. Jaki Steptoe E. Candis Schatz, MD 10/24/2021 9:47:49 AM This report has been signed electronically.

## 2021-10-24 NOTE — Progress Notes (Signed)
PT taken to PACU. Monitors in place. VSS. Report given to RN. 

## 2021-10-24 NOTE — Patient Instructions (Signed)
Handouts on Diverticulosis and polyps given   YOU HAD AN ENDOSCOPIC PROCEDURE TODAY AT [place]:   Refer to the procedure report that was given to you for any specific questions about what was found during the examination.  If the procedure report does not answer your questions, please call your gastroenterologist to clarify.  If you requested that your care partner not be given the details of your procedure findings, then the procedure report has been included in a sealed envelope for you to review at your convenience later.  YOU SHOULD EXPECT: Some feelings of bloating in the abdomen. Passage of more gas than usual.  Walking can help get rid of the air that was put into your GI tract during the procedure and reduce the bloating. If you had a lower endoscopy (such as a colonoscopy or flexible sigmoidoscopy) you may notice spotting of blood in your stool or on the toilet paper. If you underwent a bowel prep for your procedure, you may not have a normal bowel movement for a few days.  Please Note:  You might notice some irritation and congestion in your nose or some drainage.  This is from the oxygen used during your procedure.  There is no need for concern and it should clear up in a day or so.  SYMPTOMS TO REPORT IMMEDIATELY:  Following lower endoscopy (colonoscopy or flexible sigmoidoscopy):  Excessive amounts of blood in the stool  Significant tenderness or worsening of abdominal pains  Swelling of the abdomen that is new, acute  Fever of 100F or higher   For urgent or emergent issues, a gastroenterologist can be reached at any hour by calling [phone]. Do not use MyChart messaging for urgent concerns.    DIET:  We do recommend a small meal at first, but then you may proceed to your regular diet.  Drink plenty of fluids but you should avoid alcoholic beverages for 24 hours.  ACTIVITY:  You should plan to take it easy for the rest of today and you should NOT DRIVE  or use heavy machinery until tomorrow (because of the sedation medicines used during the test).    FOLLOW UP: Our staff will call the number listed on your records 48-72 hours following your procedure to check on you and address any questions or concerns that you may have regarding the information given to you following your procedure. If we do not reach you, we will leave a message.  We will attempt to reach you two times.  During this call, we will ask if you have developed any symptoms of COVID 19. If you develop any symptoms (ie: fever, flu-like symptoms, shortness of breath, cough etc.) before then, please call [phone].  If you test positive for Covid 19 in the 2 weeks post procedure, please call and report this information to [place].    If any biopsies were taken you will be contacted by phone or by letter within the next 1-3 weeks.  Please call us at [phone] if you have not heard about the biopsies in 3 weeks.    SIGNATURES/CONFIDENTIALITY: You and/or your care partner have signed paperwork which will be entered into your electronic medical record.  These signatures attest to the fact that that the information above on your After Visit Summary has been reviewed and is understood.  Full responsibility of the confidentiality of this discharge information lies with you and/or your care-partner.

## 2021-10-26 ENCOUNTER — Telehealth: Payer: Self-pay | Admitting: *Deleted

## 2021-10-26 NOTE — Telephone Encounter (Signed)
°  Follow up Call-  Call back number 10/24/2021  Post procedure Call Back phone  # [phone]  Permission to leave phone message Yes  Some recent data might be hidden     Patient questions:  Do you have a fever, pain , or abdominal swelling? No. Pain Score  0 *  Have you tolerated food without any problems? Yes.    Have you been able to return to your normal activities? Yes.    Do you have any questions about your discharge instructions: Diet   No. Medications  No. Follow up visit  No.  Do you have questions or concerns about your Care? No.  Actions: * If pain score is 4 or above: No action needed, pain <4.  Have you developed a fever since your procedure? no  2.   Have you had an respiratory symptoms (SOB or cough) since your procedure? no  3.   Have you tested positive for COVID 19 since your procedure no  4.   Have you had any family members/close contacts diagnosed with the COVID 19 since your procedure?  no   If yes to any of these questions please route to Joylene John, RN and Joella Prince, RN

## 2021-11-02 NOTE — Progress Notes (Signed)
Mr. Sulkowski,  Good news: the polyp that I removed during your recent examination was NOT precancerous.  You should continue to follow current colorectal cancer screening guidelines with a repeat colonoscopy in 10 years.    If you develop any new rectal bleeding, abdominal pain or significant bowel habit changes, please contact me before then.

## 2022-05-03 ENCOUNTER — Ambulatory Visit: Payer: BC Managed Care – PPO | Admitting: Sports Medicine

## 2022-05-03 ENCOUNTER — Ambulatory Visit (INDEPENDENT_AMBULATORY_CARE_PROVIDER_SITE_OTHER): Payer: BC Managed Care – PPO

## 2022-05-03 VITALS — BP 130/82 | HR 70 | Ht 74.0 in | Wt 246.0 lb

## 2022-05-03 DIAGNOSIS — M79672 Pain in left foot: Secondary | ICD-10-CM | POA: Diagnosis not present

## 2022-05-03 MED ORDER — MELOXICAM 15 MG PO TABS: 15.0000 mg | ORAL_TABLET | Freq: Every day | ORAL | 0 refills | Status: DC

## 2022-05-03 NOTE — Progress Notes (Signed)
    Clayton Kirby D.Clayton Kirby Phone: [phone]   Assessment and Plan:     1. Left foot pain -Chronic with exacerbation, initial visit - Midfoot arthritis flared by patient dropping object onto his foot - Start meloxicam 15 mg daily x2 weeks.  If still having pain after 2 weeks, complete 3rd-week of meloxicam. May use remaining meloxicam as needed once daily for pain control.  Do not to use additional NSAIDs while taking meloxicam.  May use Tylenol [phone] mg 2 to 3 times a day for breakthrough pain. - X-ray obtained in clinic.  My location: No acute fracture or dislocation.  Global midfoot arthritis with cortical spurring.  Degenerative changes of sesamoid bones first MTP - DG Foot Complete Left; Future    Pertinent previous records reviewed include none   Follow Up: As needed if no improvement or worsening symptoms   Subjective:   I, Clayton Kirby, am serving as a Education administrator for Doctor Clayton Kirby  Chief Complaint: foot pain   HPI:   05/03/22 Patient is a 66 year old male complaining of foot pain. Patient states that he dropped an isle on it on Monday , when he puts pressure on his foot the foot locks and then he feels something pop loose, no numbness or tingling, take tylenol for his knee , feels the soreness   Relevant Historical Information: None pertinent  Additional pertinent review of systems negative.   Current Outpatient Medications:    atorvastatin (LIPITOR) 20 MG tablet, Take 1 tablet (20 mg total) by mouth daily., Disp: 90 tablet, Rfl: 1   meloxicam (MOBIC) 15 MG tablet, Take 1 tablet (15 mg total) by mouth daily., Disp: 30 tablet, Rfl: 0   Turmeric (QC TUMERIC COMPLEX PO), Take by mouth., Disp: , Rfl:    Objective:     Vitals:   05/03/22 0853  BP: 130/82  Pulse: 70  SpO2: 98%  Weight: 246 lb (111.6 kg)  Height: '6\' 2"'$  (1.88 m)      Body mass index is 31.58 kg/m.    Physical  Exam:    Gen: Appears well, nad, nontoxic and pleasant Psych: Alert and oriented, appropriate mood and affect Neuro: sensation intact, strength is 5/5 with df/pf/inv/ev, muscle tone wnl Skin: no susupicious lesions or rashes  Left foot/ankle: no deformity, no swelling or effusion TTP navicular, dorsal midfoot NTTP over fibular head, lat mal, medial mal, achilles,  base of 5th, ATFL, CFL, deltoid, calcaneous or midfoot ROM DF 30, PF 45, inv/ev intact Negative ant drawer, talar tilt, rotation test, squeeze test. Neg thompson No pain with resisted inversion or eversion    Electronically signed by:  [facility] 9:29 AM 05/03/22

## 2022-05-03 NOTE — Patient Instructions (Addendum)
Good to see you  - Start meloxicam 15 mg daily x2 weeks.  If still having pain after 2 weeks, complete 3rd-week of meloxicam. May use remaining meloxicam as needed once daily for pain control.  Do not to use additional NSAIDs while taking meloxicam.  May use Tylenol 500-1000 mg 2 to 3 times a day for breakthrough pain. As needed follow up  

## 2022-08-01 ENCOUNTER — Ambulatory Visit: Payer: Self-pay

## 2022-08-01 ENCOUNTER — Ambulatory Visit: Payer: BC Managed Care – PPO | Admitting: Family Medicine

## 2022-08-01 VITALS — BP 164/88 | HR 64 | Ht 74.0 in | Wt 245.0 lb

## 2022-08-01 DIAGNOSIS — M79632 Pain in left forearm: Secondary | ICD-10-CM | POA: Diagnosis not present

## 2022-08-01 NOTE — Patient Instructions (Signed)
Thank you for coming in today.   Ok to resume bowling when feeling better.   2-3 pound hand weight up and down slowly and a hammer rotation slowly.   Please use Voltaren gel (Generic Diclofenac Gel) up to 4x daily for pain as needed.  This is available over-the-counter as both the name brand Voltaren gel and the generic diclofenac gel.   Recheck as needed.   If not better we can add hand therapy

## 2022-08-01 NOTE — Progress Notes (Signed)
   I, Peterson Lombard, LAT, ATC acting as a scribe for Lynne Leader, MD.  Clayton Kirby is a 66 y.o. male who presents to [facility] today for L arm pain. Pt was last seen by Dr. [name] on 05/03/22 for L foot pain and by Dr. Georgina Snell on 09/01/21 for chronic L shoulder pain. Pt had a L subacromial steroid injection on 07/13/21. Today, pt reports he got a flu shot and COVID vaccines 3 weeks ago and then went to bowl and experienced weakness in his L arm. Pt locates pain to the forearm, along the lateral aspect and sometimes into the biceps.   Grip strength: normal Numbness/tingling: no Aggravates: pronation/supination Treatments tried: IBU  Dx imaging: 09/01/21 L clavicle XR  04/19/21 L shoulder XR  Pertinent review of systems: No fevers or chills  Relevant historical information: Hyperlipidemia.   Exam:  BP (!) 164/88   Pulse 64   Ht '6\' 2"'$  (1.88 m)   Wt 245 lb (111.1 kg)   SpO2 96%   BMI 31.46 kg/m  General: Well Developed, well nourished, and in no acute distress.   MSK: Left elbow normal-appearing Range of motion lacks full extension and full supination by about 5 degrees to 3 degrees. Intact strength. Some pain present with resisted wrist extension present in the lateral forearm.  No significant pain with resisted wrist flexion. Some pain present with pronation and supination present in the dorsal and volar forearm. Strength is intact.  Pulses capillary fill and sensation are intact distally.  Grip strength is intact.    Lab and Radiology Results  Diagnostic Limited MSK Ultrasound of: Left elbow wrist Lateral epicondyle visualized.  Degenerative appearing lateral epicondyle without fracture or tear of lateral common extensor tendon origin. Normal-appearing forearm musculature dorsally. 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 male with left elbow and  forearm pain.  This occurred after bowling and following COVID and flu vaccines.  I believe he had a pro inflammatory state in his body after vaccination and had an overuse event with bowling.  Plan for home exercise program focused on forearm rehab including exercise protocol for lateral and medial epicondylitis which should work in the dorsal and volar forearm musculature.  When feeling well enough it is okay for him to resume bowling.  Voltaren gel may be helpful as well.  Consider formal hand therapy if needed.   PDMP not reviewed this encounter. Orders Placed This Encounter  Procedures   [place] LIMITED JOINT SPACE STRUCTURES UP LEFT(NO LINKED CHARGES)    Order Specific Question:   Reason for Exam (SYMPTOM  OR DIAGNOSIS REQUIRED)    Answer:   left forearm pain    Order Specific Question:   Preferred imaging location?    Answer:   [place]   No orders of the defined types were placed in this encounter.    Discussed warning signs or symptoms. Please see discharge instructions. Patient expresses understanding.   The above documentation has been reviewed and is accurate and complete Lynne Leader, M.D.

## 2022-08-14 NOTE — Progress Notes (Signed)
Patient ID: Clayton Kirby, male    DOB: July 21, 1956  MRN: [id number]  CC: Annual Physical Exam  Subjective: Clayton Kirby is a 66 y.o. male who presents for annual physical exam.   His concerns today include:  - Established with Gastroenterology.  - Established with Sports Medicine. - No longer taking Atorvastatin for cholesterol management.  - States has skin tag right upper flank and right groin. Denies any symptoms.   Patient Active Problem List   Diagnosis Date Noted   Hyperlipidemia 08/16/2021   Chondrocalcinosis of right knee 04/19/2021     Current Outpatient Medications on File Prior to Visit  Medication Sig Dispense Refill   atorvastatin (LIPITOR) 20 MG tablet Take 1 tablet (20 mg total) by mouth daily. 90 tablet 1   Turmeric (QC TUMERIC COMPLEX PO) Take by mouth.     No current facility-administered medications on file prior to visit.    No Known Allergies  Social History   Socioeconomic History   Marital status: Married    Spouse name: Not on file   Number of children: Not on file   Years of education: Not on file   Highest education level: Not on file  Occupational History   Not on file  Tobacco Use   Smoking status: Never    Passive exposure: Never   Smokeless tobacco: Never  Vaping Use   Vaping Use: Never used  Substance and Sexual Activity   Alcohol use: No   Drug use: No   Sexual activity: Not on file  Other Topics Concern   Not on file  Social History Narrative   Not on file   Social Determinants of Health   Financial Resource Strain: Not on file  Food Insecurity: Not on file  Transportation Needs: Not on file  Physical Activity: Not on file  Stress: Not on file  Social Connections: Not on file  Intimate Partner Violence: Not on file    Family History  Problem Relation Age of Onset   Colon cancer Neg Hx    Esophageal cancer Neg Hx    Stomach cancer Neg Hx     Past Surgical History:  Procedure Laterality Date   APPENDECTOMY   2001    ROS: Review of Systems Negative except as stated above  PHYSICAL EXAM: BP 119/74 (BP Location: Left Arm, Patient Position: Sitting, Cuff Size: Large)   Pulse 71   Temp 98.5 F (36.9 C)   Resp 16   Ht 6' 2.02" (1.88 m)   Wt 240 lb (108.9 kg)   SpO2 98%   BMI 30.80 kg/m   Physical Exam HENT:     Head: Normocephalic and atraumatic.     Right Ear: Tympanic membrane, ear canal and external ear normal.     Left Ear: Tympanic membrane, ear canal and external ear normal.     Nose: Nose normal.     Mouth/Throat:     Mouth: Mucous membranes are moist.     Pharynx: Oropharynx is clear.  Eyes:     Extraocular Movements: Extraocular movements intact.     Conjunctiva/sclera: Conjunctivae normal.     Pupils: Pupils are equal, round, and reactive to light.  Cardiovascular:     Rate and Rhythm: Normal rate and regular rhythm.     Pulses: Normal pulses.     Heart sounds: Normal heart sounds.  Pulmonary:     Effort: Pulmonary effort is normal.     Breath sounds: Normal breath sounds.  Abdominal:  General: Bowel sounds are normal.     Palpations: Abdomen is soft.  Genitourinary:    Comments: Patient declined.  Musculoskeletal:        General: Normal range of motion.     Right shoulder: Normal.     Left shoulder: Normal.     Right upper arm: Normal.     Left upper arm: Normal.     Right elbow: Normal.     Left elbow: Normal.     Right forearm: Normal.     Left forearm: Normal.     Right wrist: Normal.     Left wrist: Normal.     Right hand: Normal.     Left hand: Normal.     Cervical back: Normal, normal range of motion and neck supple.     Thoracic back: Normal.     Lumbar back: Normal.     Right hip: Normal.     Left hip: Normal.     Right upper leg: Normal.     Left upper leg: Normal.     Right knee: Normal.     Left knee: Normal.     Right lower leg: Normal.     Left lower leg: Normal.     Right ankle: Normal.     Left ankle: Normal.     Right foot:  Normal.     Left foot: Normal.  Skin:    General: Skin is warm and dry.     Capillary Refill: Capillary refill takes less than 2 seconds.     Comments: Skin tag right upper flank. No additional symptoms.   Neurological:     General: No focal deficit present.     Mental Status: He is alert and oriented to person, place, and time.  Psychiatric:        Mood and Affect: Mood normal.        Behavior: Behavior normal.     ASSESSMENT AND PLAN: 1. Annual physical exam - Counseled on 150 minutes of exercise per week as tolerated, healthy eating (including decreased daily intake of saturated fats, cholesterol, added sugars, sodium), STI prevention, and routine healthcare maintenance.  2. Screening for metabolic disorder - Routine screening.  - CMP14+EGFR  3. Screening for deficiency anemia - Routine screening.  - CBC  4. Diabetes mellitus screening - Routine screening.  - Hemoglobin A1c  5. Thyroid disorder screen - Routine screening.  - TSH  6. Hyperlipidemia, unspecified hyperlipidemia type - Routine screening.  - Lipid panel  7. Skin tag - Referral to Dermatology for further evaluation and management.  - Ambulatory referral to Dermatology  8. Need for Tdap vaccination - Administered. - Tdap vaccine greater than or equal to 7yo IM    Patient was given the opportunity to ask questions.  Patient verbalized understanding of the plan and was able to repeat key elements of the plan. Patient was given clear instructions to go to Emergency Department or return to medical center if symptoms don't improve, worsen, or new problems develop.The patient verbalized understanding.   Orders Placed This Encounter  Procedures   Tdap vaccine greater than or equal to 7yo IM   CBC   Lipid panel   TSH   CMP14+EGFR   Hemoglobin A1c   Ambulatory referral to Dermatology    Return in about 1 year (around 08/24/2023) for Physical per patient preference.  Camillia Herter, NP

## 2022-08-23 ENCOUNTER — Encounter: Payer: Self-pay | Admitting: Family

## 2022-08-23 ENCOUNTER — Ambulatory Visit (INDEPENDENT_AMBULATORY_CARE_PROVIDER_SITE_OTHER): Payer: BC Managed Care – PPO | Admitting: Family

## 2022-08-23 VITALS — BP 119/74 | HR 71 | Temp 98.5°F | Resp 16 | Ht 74.02 in | Wt 240.0 lb

## 2022-08-23 DIAGNOSIS — Z0000 Encounter for general adult medical examination without abnormal findings: Secondary | ICD-10-CM | POA: Diagnosis not present

## 2022-08-23 DIAGNOSIS — Z23 Encounter for immunization: Secondary | ICD-10-CM | POA: Diagnosis not present

## 2022-08-23 DIAGNOSIS — E785 Hyperlipidemia, unspecified: Secondary | ICD-10-CM

## 2022-08-23 DIAGNOSIS — Z131 Encounter for screening for diabetes mellitus: Secondary | ICD-10-CM | POA: Diagnosis not present

## 2022-08-23 DIAGNOSIS — Z130 Encounter for screening for diseases of the blood and blood-forming organs and certain disorders involving the immune mechanism: Secondary | ICD-10-CM

## 2022-08-23 DIAGNOSIS — L918 Other hypertrophic disorders of the skin: Secondary | ICD-10-CM

## 2022-08-23 DIAGNOSIS — Z13228 Encounter for screening for other metabolic disorders: Secondary | ICD-10-CM

## 2022-08-23 DIAGNOSIS — Z1329 Encounter for screening for other suspected endocrine disorder: Secondary | ICD-10-CM

## 2022-08-23 NOTE — Progress Notes (Signed)
Pt presents for annual physical exam   -TDAP administered

## 2022-08-23 NOTE — Patient Instructions (Signed)
Preventive Care 65 Years and Older, Male Preventive care refers to lifestyle choices and visits with your health care provider that can promote health and wellness. Preventive care visits are also called wellness exams. What can I expect for my preventive care visit? Counseling During your preventive care visit, your health care provider may ask about your: Medical history, including: Past medical problems. Family medical history. History of falls. Current health, including: Emotional well-being. Home life and relationship well-being. Sexual activity. Memory and ability to understand (cognition). Lifestyle, including: Alcohol, nicotine or tobacco, and drug use. Access to firearms. Diet, exercise, and sleep habits. Work and work environment. Sunscreen use. Safety issues such as seatbelt and bike helmet use. Physical exam Your health care provider will check your: Height and weight. These may be used to calculate your BMI (body mass index). BMI is a measurement that tells if you are at a healthy weight. Waist circumference. This measures the distance around your waistline. This measurement also tells if you are at a healthy weight and may help predict your risk of certain diseases, such as type 2 diabetes and high blood pressure. Heart rate and blood pressure. Body temperature. Skin for abnormal spots. What immunizations do I need?  Vaccines are usually given at various ages, according to a schedule. Your health care provider will recommend vaccines for you based on your age, medical history, and lifestyle or other factors, such as travel or where you work. What tests do I need? Screening Your health care provider may recommend screening tests for certain conditions. This may include: Lipid and cholesterol levels. Diabetes screening. This is done by checking your blood sugar (glucose) after you have not eaten for a while (fasting). Hepatitis C test. Hepatitis B test. HIV (human  immunodeficiency virus) test. STI (sexually transmitted infection) testing, if you are at risk. Lung cancer screening. Colorectal cancer screening. Prostate cancer screening. Abdominal aortic aneurysm (AAA) screening. You may need this if you are a current or former smoker. Talk with your health care provider about your test results, treatment options, and if necessary, the need for more tests. Follow these instructions at home: Eating and drinking  Eat a diet that includes fresh fruits and vegetables, whole grains, lean protein, and low-fat dairy products. Limit your intake of foods with high amounts of sugar, saturated fats, and salt. Take vitamin and mineral supplements as recommended by your health care provider. Do not drink alcohol if your health care provider tells you not to drink. If you drink alcohol: Limit how much you have to 0-2 drinks a day. Know how much alcohol is in your drink. In the [place] one drink equals one 12 oz bottle of beer (355 mL), one 5 oz glass of wine (148 mL), or one 1 oz glass of hard liquor (44 mL). Lifestyle Brush your teeth every morning and night with fluoride toothpaste. Floss one time each day. Exercise for at least 30 minutes 5 or more days each week. Do not use any products that contain nicotine or tobacco. These products include cigarettes, chewing tobacco, and vaping devices, such as e-cigarettes. If you need help quitting, ask your health care provider. Do not use drugs. If you are sexually active, practice safe sex. Use a condom or other form of protection to prevent STIs. Take aspirin only as told by your health care provider. Make sure that you understand how much to take and what form to take. Work with your health care provider to find out whether it is safe   and beneficial for you to take aspirin daily. Ask your health care provider if you need to take a cholesterol-lowering medicine (statin). Find healthy ways to manage stress, such  as: Meditation, yoga, or listening to music. Journaling. Talking to a trusted person. Spending time with friends and family. Safety Always wear your seat belt while driving or riding in a vehicle. Do not drive: If you have been drinking alcohol. Do not ride with someone who has been drinking. When you are tired or distracted. While texting. If you have been using any mind-altering substances or drugs. Wear a helmet and other protective equipment during sports activities. If you have firearms in your house, make sure you follow all gun safety procedures. Minimize exposure to UV radiation to reduce your risk of skin cancer. What's next? Visit your health care provider once a year for an annual wellness visit. Ask your health care provider how often you should have your eyes and teeth checked. Stay up to date on all vaccines. This information is not intended to replace advice given to you by your health care provider. Make sure you discuss any questions you have with your health care provider. Document Revised: 04/27/2021 Document Reviewed: 04/27/2021 Elsevier Patient Education  2023 Elsevier Inc.  

## 2022-08-24 ENCOUNTER — Other Ambulatory Visit: Payer: Self-pay | Admitting: Family

## 2022-08-24 DIAGNOSIS — E785 Hyperlipidemia, unspecified: Secondary | ICD-10-CM

## 2022-08-24 LAB — CMP14+EGFR
ALT: 18 IU/L (ref 0–44)
AST: 36 IU/L (ref 0–40)
Albumin/Globulin Ratio: 1.8 (ref 1.2–2.2)
Albumin: 4.4 g/dL (ref 3.9–4.9)
Alkaline Phosphatase: 58 IU/L (ref 44–121)
BUN/Creatinine Ratio: 13 (ref 10–24)
BUN: 15 mg/dL (ref 8–27)
Bilirubin Total: 1.5 mg/dL — ABNORMAL HIGH (ref 0.0–1.2)
CO2: 23 mmol/L (ref 20–29)
Calcium: 9.4 mg/dL (ref 8.6–10.2)
Chloride: 104 mmol/L (ref 96–106)
Creatinine, Ser: 1.18 mg/dL (ref 0.76–1.27)
Globulin, Total: 2.4 g/dL (ref 1.5–4.5)
Glucose: 118 mg/dL — ABNORMAL HIGH (ref 70–99)
Potassium: 4 mmol/L (ref 3.5–5.2)
Sodium: 142 mmol/L (ref 134–144)
Total Protein: 6.8 g/dL (ref 6.0–8.5)
eGFR: 68 mL/min/{1.73_m2} (ref >59)

## 2022-08-24 LAB — HEMOGLOBIN A1C
Est. average glucose Bld gHb Est-mCnc: 91 mg/dL
Hgb A1c MFr Bld: 4.8 % (ref 4.8–5.6)

## 2022-08-24 LAB — CBC
Hematocrit: 39.9 % (ref 37.5–51.0)
Hemoglobin: 13 g/dL (ref 13.0–17.7)
MCH: 26.7 pg (ref 26.6–33.0)
MCHC: 32.6 g/dL (ref 31.5–35.7)
MCV: 82 fL (ref 79–97)
Platelets: 226 10*3/uL (ref 150–450)
RBC: 4.87 x10E6/uL (ref 4.14–5.80)
RDW: 12.7 % (ref 11.6–15.4)
WBC: 4.7 10*3/uL (ref 3.4–10.8)

## 2022-08-24 LAB — LIPID PANEL
Chol/HDL Ratio: 2.9 ratio (ref 0.0–5.0)
Cholesterol, Total: 217 mg/dL — ABNORMAL HIGH (ref 100–199)
HDL: 76 mg/dL (ref >39)
LDL Chol Calc (NIH): 130 mg/dL — ABNORMAL HIGH (ref 0–99)
Triglycerides: 60 mg/dL (ref 0–149)
VLDL Cholesterol Cal: 11 mg/dL (ref 5–40)

## 2022-08-24 LAB — TSH: TSH: 3.34 u[IU]/mL (ref 0.450–4.500)

## 2022-08-24 MED ORDER — ATORVASTATIN CALCIUM 20 MG PO TABS: 20.0000 mg | ORAL_TABLET | Freq: Every day | ORAL | 2 refills | Status: DC

## 2023-02-02 IMAGING — DX DG CLAVICLE*L*
2 series · 2 of 2 positions shown · non-contrast
Comparison: None.

CLINICAL DATA: Pain.

EXAM:
LEFT CLAVICLE - 2+ VIEWS

[clavicle ap]
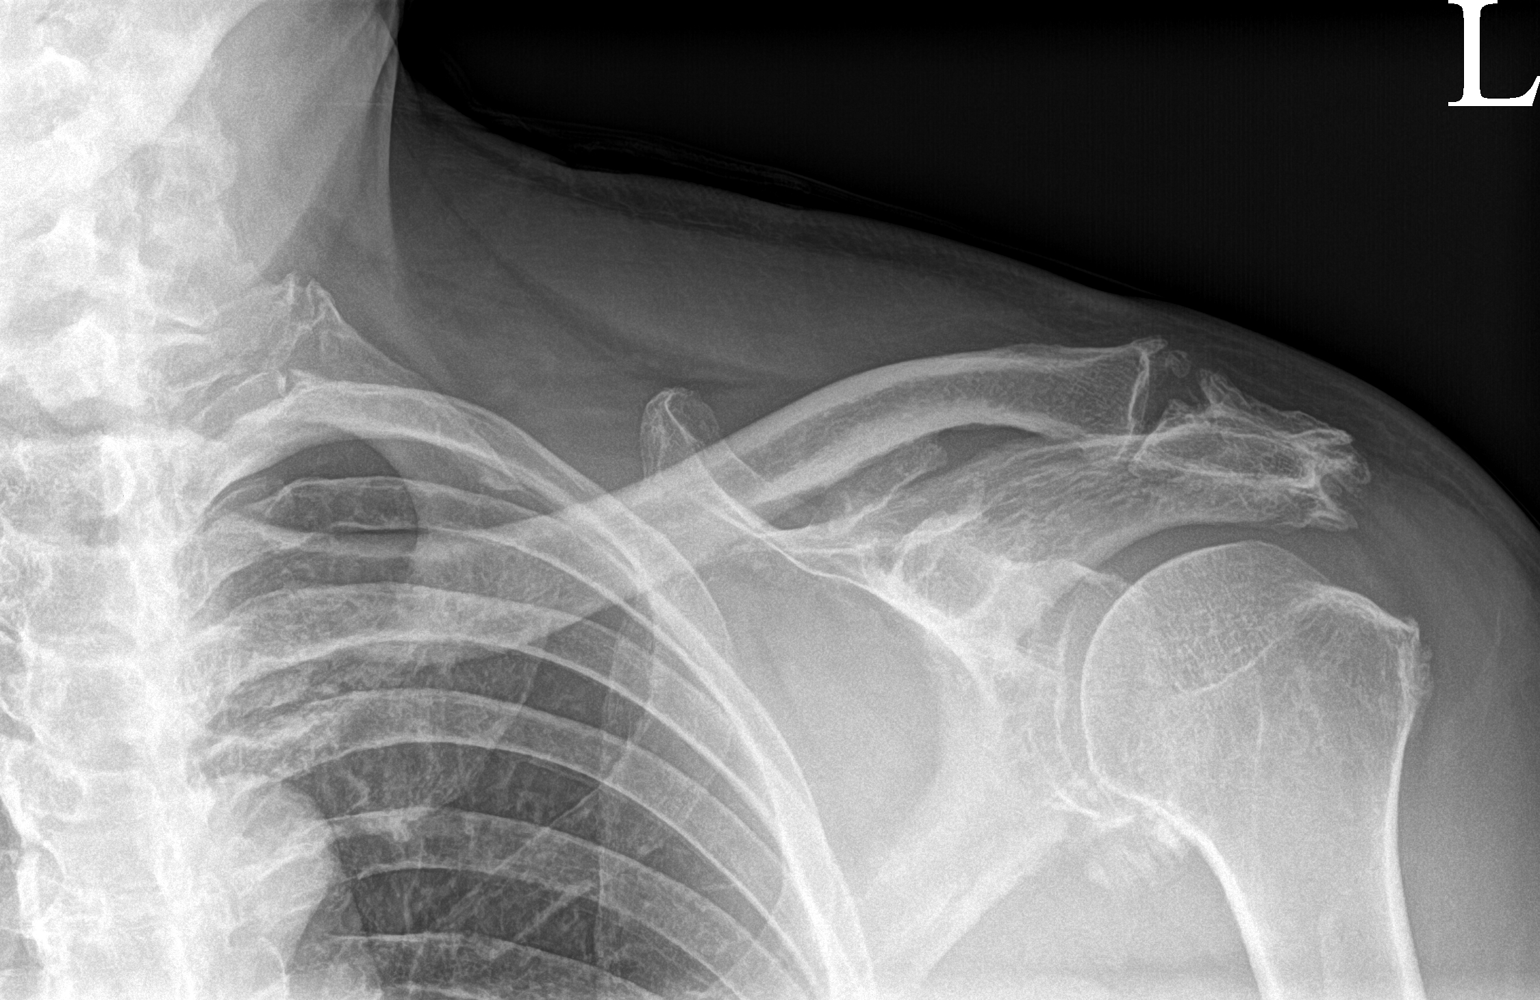

[clavicle axial]
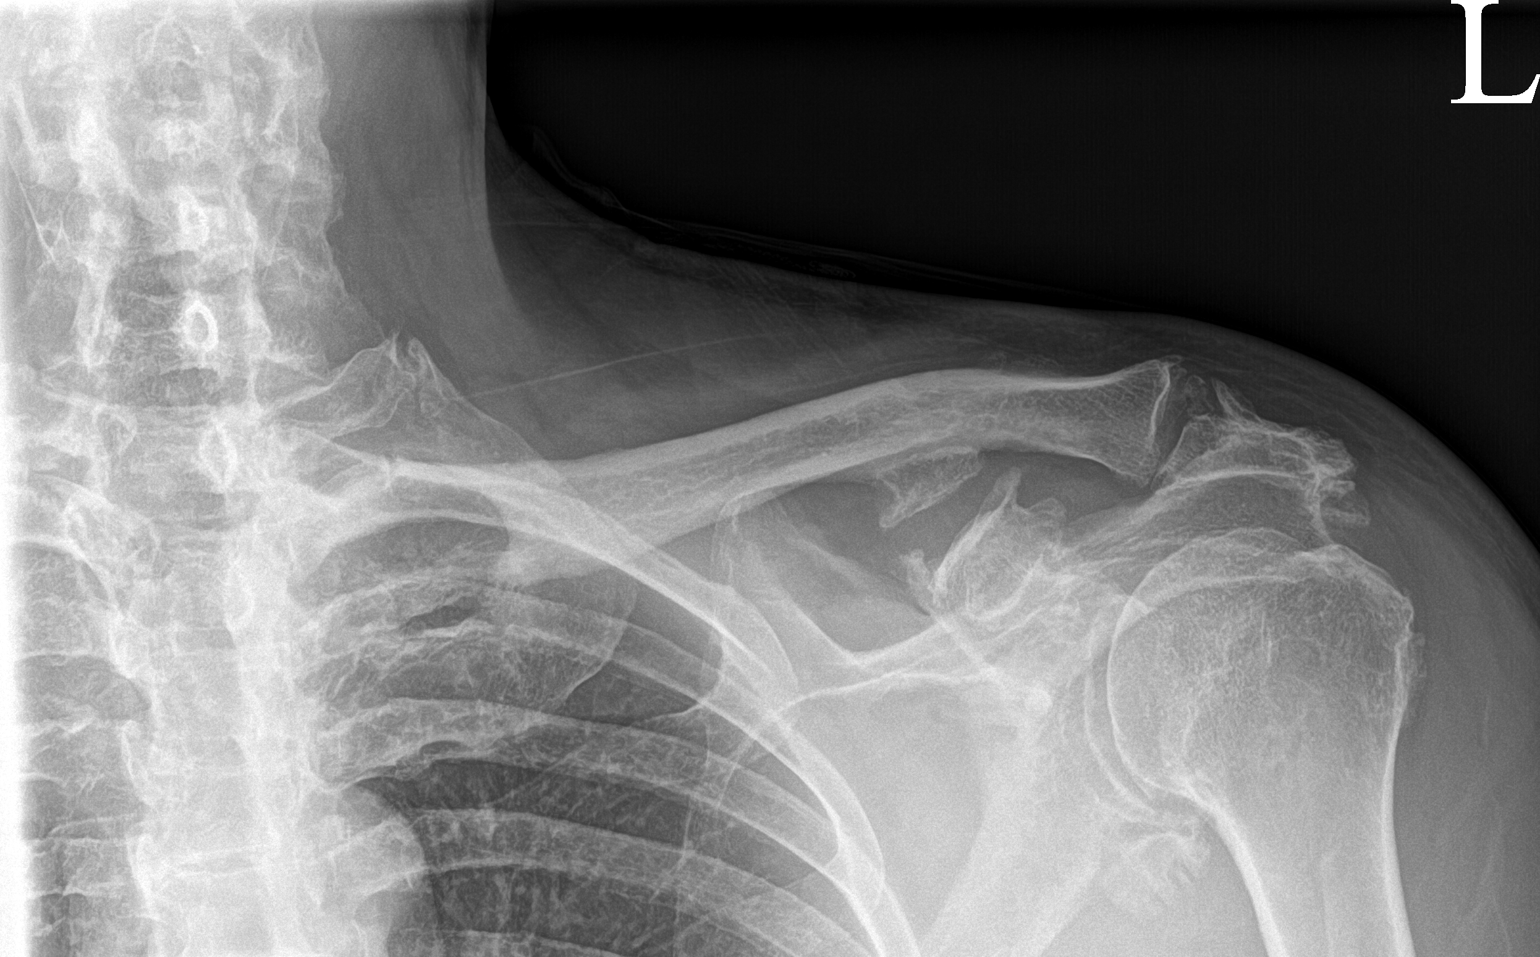

[2 of 2 positions shown; findings below may reference images not displayed]

FINDINGS: AC joint degenerative changes are noted. Enthesopathic changes are
seen at the rotator cuff insertion site on the lateral head of the
humerus. Glenohumeral degenerative changes are noted. No fracture or
dislocation.
IMPRESSION: Degenerative changes as above.

## 2023-03-19 ENCOUNTER — Other Ambulatory Visit: Payer: Self-pay | Admitting: Family

## 2023-03-19 DIAGNOSIS — E785 Hyperlipidemia, unspecified: Secondary | ICD-10-CM

## 2023-03-20 MED ORDER — ATORVASTATIN CALCIUM 20 MG PO TABS: 20.0000 mg | ORAL_TABLET | Freq: Every day | ORAL | 2 refills | Status: DC

## 2023-04-24 ENCOUNTER — Ambulatory Visit
Admission: EM | Admit: 2023-04-24 | Discharge: 2023-04-24 | Disposition: A | Discharge status: Home / Self Care | Payer: Medicare Other | Attending: Internal Medicine | Admitting: Internal Medicine

## 2023-04-24 ENCOUNTER — Ambulatory Visit (INDEPENDENT_AMBULATORY_CARE_PROVIDER_SITE_OTHER): Payer: Medicare Other

## 2023-04-24 DIAGNOSIS — R350 Frequency of micturition: Secondary | ICD-10-CM | POA: Diagnosis not present

## 2023-04-24 DIAGNOSIS — R109 Unspecified abdominal pain: Secondary | ICD-10-CM

## 2023-04-24 LAB — POCT URINALYSIS DIP (MANUAL ENTRY)
Bilirubin, UA: NEGATIVE
Glucose, UA: NEGATIVE mg/dL
Ketones, POC UA: NEGATIVE mg/dL
Leukocytes, UA: NEGATIVE
Nitrite, UA: NEGATIVE
Protein Ur, POC: NEGATIVE mg/dL
Spec Grav, UA: 1.01 (ref 1.010–1.025)
Urobilinogen, UA: 0.2 E.U./dL
pH, UA: 5.5 (ref 5.0–8.0)

## 2023-04-24 MED ORDER — TAMSULOSIN HCL 0.4 MG PO CAPS: 0.4000 mg | ORAL_CAPSULE | Freq: Every day | ORAL | 0 refills | Status: AC

## 2023-04-24 NOTE — ED Provider Notes (Addendum)
[facility]    CSN: [id number] Arrival date & time: 04/24/23  0947      History   Chief Complaint Chief Complaint  Patient presents with   Dysuria   Back Pain    HPI Clayton Kirby is a 67 y.o. male.   Patient presents with left lower back pain and urinary frequency that started about 3 days ago.  He denies any injury to the back.  Denies dysuria or hematuria.  Denies abdominal pain, fever, chills, nausea, vomiting.  Patient reports that he had a kidney stone about 30 years ago and this feels very similar.  Not taking any medication for his back pain.   Dysuria Back Pain   Past Medical History:  Diagnosis Date   Hyperlipidemia     Patient Active Problem List   Diagnosis Date Noted   Hyperlipidemia 08/16/2021   Chondrocalcinosis of right knee 04/19/2021    Past Surgical History:  Procedure Laterality Date   APPENDECTOMY  2001       Home Medications    Prior to Admission medications   Medication Sig Start Date End Date Taking? Authorizing Provider  atorvastatin (LIPITOR) 20 MG tablet Take 1 tablet (20 mg total) by mouth daily. 03/20/23 06/18/23 Yes Zonia Kief, Amy J, NP  tamsulosin (FLOMAX) 0.4 MG CAPS capsule Take 1 capsule (0.4 mg total) by mouth daily. 04/24/23  Yes Alva Kuenzel, Acie Fredrickson, FNP  Turmeric (QC TUMERIC COMPLEX PO) Take by mouth.   Yes [provider]    Family History Family History  Problem Relation Age of Onset   Colon cancer Neg Hx    Esophageal cancer Neg Hx    Stomach cancer Neg Hx     Social History Social History   Tobacco Use   Smoking status: Never    Passive exposure: Never   Smokeless tobacco: Never  Vaping Use   Vaping Use: Never used  Substance Use Topics   Alcohol use: No   Drug use: No     Allergies   Patient has no known allergies.   Review of Systems Review of Systems Per HPI  Physical Exam Triage Vital Signs ED Triage Vitals  Enc Vitals Group     BP 04/24/23 1046 127/72     Pulse Rate  04/24/23 1046 61     Resp 04/24/23 1046 18     Temp 04/24/23 1046 98 F (36.7 C)     Temp Source 04/24/23 1046 Oral     SpO2 04/24/23 1046 96 %     Weight --      Height --      Head Circumference --      Peak Flow --      Pain Score 04/24/23 1049 6     Pain Loc --      Pain Edu? --      Excl. in GC? --    No data found.  Updated Vital Signs BP 127/72 (BP Location: Left Arm)   Pulse 61   Temp 98 F (36.7 C) (Oral)   Resp 18   SpO2 96%   Visual Acuity Right Eye Distance:   Left Eye Distance:   Bilateral Distance:    Right Eye Near:   Left Eye Near:    Bilateral Near:     Physical Exam Constitutional:      General: He is not in acute distress.    Appearance: Normal appearance. He is not toxic-appearing or diaphoretic.  HENT:     Head:  Normocephalic and atraumatic.  Eyes:     Extraocular Movements: Extraocular movements intact.     Conjunctiva/sclera: Conjunctivae normal.  Pulmonary:     Effort: Pulmonary effort is normal.  Musculoskeletal:     Comments: Patient does not have any tenderness to palpation to left lower back or left flank.  No direct spinal tenderness, crepitus, step-off noted.  Neurological:     General: No focal deficit present.     Mental Status: He is alert and oriented to person, place, and time. Mental status is at baseline.  Psychiatric:        Mood and Affect: Mood normal.        Behavior: Behavior normal.        Thought Content: Thought content normal.        Judgment: Judgment normal.      UC Treatments / Results  Labs (all labs ordered are listed, but only abnormal results are displayed) Labs Reviewed  POCT URINALYSIS DIP (MANUAL ENTRY) - Abnormal; Notable for the following components:      Result Value   Blood, UA moderate (*)    All other components within normal limits    EKG   Radiology No results found.  Procedures Procedures (including critical care time)  Medications Ordered in UC Medications - No data to  display  Initial Impression / Assessment and Plan / UC Course  I have reviewed the triage vital signs and the nursing notes.  Pertinent labs & imaging results that were available during my care of the patient were reviewed by me and considered in my medical decision making (see chart for details).     UA shows red blood cells.  No signs of infection.  Given flank pain, history of kidney stones, hematuria, highly suspect kidney stone.  KUB was completed that showed no acute kidney stone.  Although, did discuss with patient that CT imaging is the most definitive for this and x-ray may not be accurate for kidney stones.  It did show some gallstones which is most likely incidental finding given patient is not having any abdominal pain and is not symptomatic.  Discussed these findings with patient.  Advised him to follow-up with family medicine doctor for further evaluation of gallstones.  Encouraged him to go to the ER if any symptoms persist or worsen or if he develops abdominal pain, nausea, vomiting.  Provided patient with GI specialty contact information if he wishes to follow-up for gallstones as well.  Tamsulosin was prescribed given concern for kidney stone.  Advised patient to follow-up with urology for further evaluation and management.  Patient states that he has an appointment with urology on 05/25/2023.Marland Kitchen  Strict ER precautions given.  Advised following up with PCP for all x-ray findings today. Patient verbalized understanding and was agreeable with plan.  X-rays not crossing over but results are"  FINDINGS: Scattered air and stool in the colon and scattered air-filled small bowel loops but no findings for obstruction or ileus. The soft tissue shadows of the abdomen are maintained. Right upper quadrant calcifications are likely gallstones.  Diffuse and marked insertional enthesopathy throughout the pelvis and hips. This could be due to inflammatory arthropathies, sarcoidosis, or collagen  vascular disease such as lupus.  IMPRESSION: 1. No plain film findings for an acute abdominal process. 2. Probable gallstones. 3. Diffuse and marked insertional enthesopathy throughout the pelvis and hips.  Final Clinical Impressions(s) / UC Diagnoses   Final diagnoses:  Flank pain  Urinary frequency  Discharge Instructions      I will call you with x-ray result.  Follow-up with urology.  I placed a referral.  If they do not call you within 48 to 72 hours, please call them yourself at provided phone number.  I prescribed a medication to help move along kidney stone if it is present.     ED Prescriptions     Medication Sig Dispense Auth. Provider   tamsulosin (FLOMAX) 0.4 MG CAPS capsule Take 1 capsule (0.4 mg total) by mouth daily. 30 capsule [place], Acie Fredrickson, [place]      PDMP not reviewed this encounter.   Gustavus Bryant, [place] 04/24/23 1435    Gustavus Bryant, [place] 04/24/23 1436    Gustavus Bryant, [place] 04/24/23 1438

## 2023-04-24 NOTE — Discharge Instructions (Signed)
I will call you with x-ray result.  Follow-up with urology.  I placed a referral.  If they do not call you within 48 to 72 hours, please call them yourself at provided phone number.  I prescribed a medication to help move along kidney stone if it is present.

## 2023-04-24 NOTE — ED Triage Notes (Signed)
Pt presents with L-side back pain x 3 days. Pt reports he is going to the restroom every 4 hours. Pt reports history of kidney stones.

## 2023-05-25 DIAGNOSIS — R3121 Asymptomatic microscopic hematuria: Secondary | ICD-10-CM | POA: Diagnosis not present

## 2023-05-30 DIAGNOSIS — L815 Leukoderma, not elsewhere classified: Secondary | ICD-10-CM | POA: Diagnosis not present

## 2023-05-30 DIAGNOSIS — L918 Other hypertrophic disorders of the skin: Secondary | ICD-10-CM | POA: Diagnosis not present

## 2023-06-13 DIAGNOSIS — N281 Cyst of kidney, acquired: Secondary | ICD-10-CM | POA: Diagnosis not present

## 2023-06-13 DIAGNOSIS — K429 Umbilical hernia without obstruction or gangrene: Secondary | ICD-10-CM | POA: Diagnosis not present

## 2023-06-13 DIAGNOSIS — R319 Hematuria, unspecified: Secondary | ICD-10-CM | POA: Diagnosis not present

## 2023-06-13 DIAGNOSIS — R3121 Asymptomatic microscopic hematuria: Secondary | ICD-10-CM | POA: Diagnosis not present

## 2023-06-13 DIAGNOSIS — R3129 Other microscopic hematuria: Secondary | ICD-10-CM | POA: Diagnosis not present

## 2023-06-25 ENCOUNTER — Other Ambulatory Visit: Payer: Self-pay | Admitting: Family

## 2023-06-25 DIAGNOSIS — E785 Hyperlipidemia, unspecified: Secondary | ICD-10-CM

## 2023-06-25 NOTE — Telephone Encounter (Signed)
 Complete. Schedule appointment.

## 2023-07-09 DIAGNOSIS — R3121 Asymptomatic microscopic hematuria: Secondary | ICD-10-CM | POA: Diagnosis not present

## 2023-07-27 ENCOUNTER — Ambulatory Visit
Admission: EM | Admit: 2023-07-27 | Discharge: 2023-07-27 | Disposition: A | Discharge status: Home / Self Care | Payer: Medicare Other | Attending: Physician Assistant | Admitting: Physician Assistant

## 2023-07-27 DIAGNOSIS — U071 COVID-19: Secondary | ICD-10-CM | POA: Diagnosis not present

## 2023-07-27 DIAGNOSIS — Z1152 Encounter for screening for COVID-19: Secondary | ICD-10-CM | POA: Diagnosis not present

## 2023-07-27 NOTE — ED Triage Notes (Signed)
"  We all went over sea's to [place] and my sister found out she has COVID19". "I am just having post nasal discharge". No fever. No cough. Symptoms started "this morning".

## 2023-07-27 NOTE — ED Provider Notes (Signed)
[facility]    CSN: [id number] Arrival date & time: 07/27/23  1422      History   Chief Complaint Chief Complaint  Patient presents with   COVID19 Testing    HPI Clayton Kirby is a 67 y.o. male.   Patient here today for evaluation of postnasal drainage after finding out his sister has COVID.  He has not had any fever.  He denies any cough.  He has not had any sore throat, nausea or vomiting.  He did take Mucinex this morning which did seem to be helpful.  The history is provided by the patient.    Past Medical History:  Diagnosis Date   Hyperlipidemia     Patient Active Problem List   Diagnosis Date Noted   Hyperlipidemia 08/16/2021   Chondrocalcinosis of right knee 04/19/2021    Past Surgical History:  Procedure Laterality Date   APPENDECTOMY  2001       Home Medications    Prior to Admission medications   Medication Sig Start Date End Date Taking? Authorizing Provider  guaiFENesin (MUCINEX PO) Take by mouth.   Yes [provider]  tamsulosin (FLOMAX) 0.4 MG CAPS capsule Take 1 capsule (0.4 mg total) by mouth daily. 04/24/23   Gustavus Bryant, FNP  Turmeric (QC TUMERIC COMPLEX PO) Take by mouth.    [provider]    Family History Family History  Problem Relation Age of Onset   Colon cancer Neg Hx    Esophageal cancer Neg Hx    Stomach cancer Neg Hx     Social History Social History   Tobacco Use   Smoking status: Never    Passive exposure: Never   Smokeless tobacco: Never  Vaping Use   Vaping status: Never Used  Substance Use Topics   Alcohol use: No   Drug use: No     Allergies   Patient has no known allergies.   Review of Systems Review of Systems  Constitutional:  Negative for chills and fever.  HENT:  Positive for postnasal drip. Negative for congestion, ear pain and sore throat.   Eyes:  Negative for discharge and redness.  Respiratory:  Negative for cough and shortness of breath.    Gastrointestinal:  Negative for abdominal pain, nausea and vomiting.     Physical Exam Triage Vital Signs ED Triage Vitals  Encounter Vitals Group     BP 07/27/23 1455 137/67     Systolic BP Percentile --      Diastolic BP Percentile --      Pulse Rate 07/27/23 1455 67     Resp 07/27/23 1455 18     Temp 07/27/23 1455 98.3 F (36.8 C)     Temp Source 07/27/23 1455 Oral     SpO2 07/27/23 1455 97 %     Weight 07/27/23 1454 245 lb (111.1 kg)     Height 07/27/23 1454 6\' 2"  (1.88 m)     Head Circumference --      Peak Flow --      Pain Score 07/27/23 1448 0     Pain Loc --      Pain Education --      Exclude from Growth Chart --    No data found.  Updated Vital Signs BP 137/67 (BP Location: Left Arm)   Pulse 67   Temp 98.3 F (36.8 C) (Oral)   Resp 18   Ht 6\' 2"  (1.88 m)   Wt 245 lb (111.1 kg)  SpO2 97%   BMI 31.46 kg/m      Physical Exam Vitals and nursing note reviewed.  Constitutional:      General: He is not in acute distress.    Appearance: Normal appearance. He is not ill-appearing.  HENT:     Head: Normocephalic and atraumatic.     Nose: Nose normal. No congestion or rhinorrhea.     Mouth/Throat:     Mouth: Mucous membranes are moist.     Pharynx: Oropharynx is clear. No oropharyngeal exudate or posterior oropharyngeal erythema.  Eyes:     Conjunctiva/sclera: Conjunctivae normal.  Cardiovascular:     Rate and Rhythm: Normal rate and regular rhythm.     Heart sounds: Normal heart sounds. No murmur heard. Pulmonary:     Effort: Pulmonary effort is normal. No respiratory distress.     Breath sounds: Normal breath sounds. No wheezing, rhonchi or rales.  Skin:    General: Skin is warm and dry.  Neurological:     Mental Status: He is alert.  Psychiatric:        Mood and Affect: Mood normal.        Thought Content: Thought content normal.      UC Treatments / Results  Labs (all labs ordered are listed, but only abnormal results are  displayed) Labs Reviewed  SARS CORONAVIRUS 2 (TAT 6-24 HRS)    EKG   Radiology No results found.  Procedures Procedures (including critical care time)  Medications Ordered in UC Medications - No data to display  Initial Impression / Assessment and Plan / UC Course  I have reviewed the triage vital signs and the nursing notes.  Pertinent labs & imaging results that were available during my care of the patient were reviewed by me and considered in my medical decision making (see chart for details).   COVID screening ordered.  Will await results for further recommendation.  Encouraged follow-up with any further concerns.   Final Clinical Impressions(s) / UC Diagnoses   Final diagnoses:  Encounter for screening for COVID-19   Discharge Instructions   None    ED Prescriptions   None    PDMP not reviewed this encounter.   Tomi Bamberger, PA-C 07/27/23 [phone]

## 2023-07-28 LAB — SARS CORONAVIRUS 2 (TAT 6-24 HRS): SARS Coronavirus 2: POSITIVE — AB

## 2023-08-22 ENCOUNTER — Ambulatory Visit (INDEPENDENT_AMBULATORY_CARE_PROVIDER_SITE_OTHER): Payer: Medicare Other | Admitting: Family

## 2023-08-22 ENCOUNTER — Encounter: Payer: Self-pay | Admitting: Family

## 2023-08-22 VITALS — BP 137/78 | HR 63 | Temp 97.5°F | Resp 16 | Ht 74.0 in | Wt 251.8 lb

## 2023-08-22 DIAGNOSIS — Z13220 Encounter for screening for lipoid disorders: Secondary | ICD-10-CM | POA: Diagnosis not present

## 2023-08-22 DIAGNOSIS — Z131 Encounter for screening for diabetes mellitus: Secondary | ICD-10-CM

## 2023-08-22 DIAGNOSIS — Z130 Encounter for screening for diseases of the blood and blood-forming organs and certain disorders involving the immune mechanism: Secondary | ICD-10-CM | POA: Diagnosis not present

## 2023-08-22 DIAGNOSIS — Z0000 Encounter for general adult medical examination without abnormal findings: Secondary | ICD-10-CM

## 2023-08-22 DIAGNOSIS — Z125 Encounter for screening for malignant neoplasm of prostate: Secondary | ICD-10-CM

## 2023-08-22 DIAGNOSIS — Z13228 Encounter for screening for other metabolic disorders: Secondary | ICD-10-CM | POA: Diagnosis not present

## 2023-08-22 DIAGNOSIS — Z1329 Encounter for screening for other suspected endocrine disorder: Secondary | ICD-10-CM

## 2023-08-22 NOTE — Progress Notes (Signed)
Patient ID: Clayton Kirby, male    DOB: 05-Dec-1955  MRN: [id number]  CC: Annual Exam  Subjective: Clayton Kirby is a 67 y.o. male who presents for annual exam.   His concerns today include:  - Reports having issues with sleep. He is not ready to trial sleep medication. He is not ready for referral to sleep specialist.  - Requests prostate screening.   Patient Active Problem List   Diagnosis Date Noted   Hyperlipidemia 08/16/2021   Chondrocalcinosis of right knee 04/19/2021     Current Outpatient Medications on File Prior to Visit  Medication Sig Dispense Refill   guaiFENesin (MUCINEX PO) Take by mouth.     tamsulosin (FLOMAX) 0.4 MG CAPS capsule Take 1 capsule (0.4 mg total) by mouth daily. 30 capsule 0   Turmeric (QC TUMERIC COMPLEX PO) Take by mouth.     No current facility-administered medications on file prior to visit.    No Known Allergies  Social History   Socioeconomic History   Marital status: Married    Spouse name: Not on file   Number of children: Not on file   Years of education: Not on file   Highest education level: Not on file  Occupational History   Not on file  Tobacco Use   Smoking status: Never    Passive exposure: Never   Smokeless tobacco: Never  Vaping Use   Vaping status: Never Used  Substance and Sexual Activity   Alcohol use: No   Drug use: No   Sexual activity: Not Currently  Other Topics Concern   Not on file  Social History Narrative   Not on file   Social Determinants of Health   Financial Resource Strain: Not on file  Food Insecurity: Not on file  Transportation Needs: Not on file  Physical Activity: Not on file  Stress: Not on file  Social Connections: Not on file  Intimate Partner Violence: Not on file    Family History  Problem Relation Age of Onset   Colon cancer Neg Hx    Esophageal cancer Neg Hx    Stomach cancer Neg Hx     Past Surgical History:  Procedure Laterality Date   APPENDECTOMY  2001     ROS: Review of Systems Negative except as stated above  PHYSICAL EXAM: BP 137/78   Pulse 63   Temp (!) 97.5 F (36.4 C) (Oral)   Resp 16   Ht 6\' 2"  (1.88 m)   Wt 251 lb 12.8 oz (114.2 kg)   SpO2 96%   PF 97 L/min   BMI 32.33 kg/m   Physical Exam HENT:     Head: Normocephalic and atraumatic.     Right Ear: Tympanic membrane, ear canal and external ear normal.     Left Ear: Tympanic membrane, ear canal and external ear normal.     Nose: Nose normal.     Mouth/Throat:     Mouth: Mucous membranes are moist.     Pharynx: Oropharynx is clear.  Eyes:     Extraocular Movements: Extraocular movements intact.     Conjunctiva/sclera: Conjunctivae normal.     Pupils: Pupils are equal, round, and reactive to light.  Neck:     Thyroid: No thyroid mass, thyromegaly or thyroid tenderness.  Cardiovascular:     Rate and Rhythm: Normal rate and regular rhythm.     Pulses: Normal pulses.     Heart sounds: Normal heart sounds.  Pulmonary:     Effort:  Pulmonary effort is normal.     Breath sounds: Normal breath sounds.  Abdominal:     General: Bowel sounds are normal.     Palpations: Abdomen is soft.  Genitourinary:    Comments: Patient declined.  Musculoskeletal:        General: Normal range of motion.     Right shoulder: Normal.     Left shoulder: Normal.     Right upper arm: Normal.     Left upper arm: Normal.     Right elbow: Normal.     Left elbow: Normal.     Right forearm: Normal.     Left forearm: Normal.     Right wrist: Normal.     Left wrist: Normal.     Right hand: Normal.     Left hand: Normal.     Cervical back: Normal, normal range of motion and neck supple.     Thoracic back: Normal.     Lumbar back: Normal.     Right hip: Normal.     Left hip: Normal.     Right upper leg: Normal.     Left upper leg: Normal.     Right knee: Normal.     Left knee: Normal.     Right lower leg: Normal.     Left lower leg: Normal.     Right ankle: Normal.     Left  ankle: Normal.     Right foot: Normal.     Left foot: Normal.  Skin:    General: Skin is warm and dry.     Capillary Refill: Capillary refill takes less than 2 seconds.  Neurological:     General: No focal deficit present.     Mental Status: He is alert and oriented to person, place, and time.  Psychiatric:        Mood and Affect: Mood normal.        Behavior: Behavior normal.     ASSESSMENT AND PLAN: 1. Annual physical exam - Counseled on 150 minutes of exercise per week as tolerated, healthy eating (including decreased daily intake of saturated fats, cholesterol, added sugars, sodium), STI prevention, and routine healthcare maintenance.  2. Screening for metabolic disorder - Routine screening.  - CMP14+EGFR  3. Screening for deficiency anemia - Routine screening.  - CBC  4. Diabetes mellitus screening - Routine screening.  - Hemoglobin A1c  5. Screening cholesterol level - Routine screening.  - Lipid panel  6. Thyroid disorder screen - Routine screening.  - TSH  7. Screening PSA (prostate specific antigen) - Routine screening.  - PSA   Patient was given the opportunity to ask questions.  Patient verbalized understanding of the plan and was able to repeat key elements of the plan. Patient was given clear instructions to go to Emergency Department or return to medical center if symptoms don't improve, worsen, or new problems develop.The patient verbalized understanding.   Orders Placed This Encounter  Procedures   CBC   Lipid panel   CMP14+EGFR   Hemoglobin A1c   TSH   PSA    Return in about 1 year (around 08/21/2024) for Physical per patient preference.  Rema Fendt, NP

## 2023-08-23 ENCOUNTER — Other Ambulatory Visit: Payer: Self-pay | Admitting: Family

## 2023-08-23 DIAGNOSIS — E785 Hyperlipidemia, unspecified: Secondary | ICD-10-CM | POA: Insufficient documentation

## 2023-08-23 DIAGNOSIS — Z13228 Encounter for screening for other metabolic disorders: Secondary | ICD-10-CM

## 2023-08-23 LAB — CBC
Hematocrit: 40 % (ref 37.5–51.0)
Hemoglobin: 12.7 g/dL — ABNORMAL LOW (ref 13.0–17.7)
MCH: 27.3 pg (ref 26.6–33.0)
MCHC: 31.8 g/dL (ref 31.5–35.7)
MCV: 86 fL (ref 79–97)
Platelets: 228 10*3/uL (ref 150–450)
RBC: 4.66 x10E6/uL (ref 4.14–5.80)
RDW: 13 % (ref 11.6–15.4)
WBC: 4.2 10*3/uL (ref 3.4–10.8)

## 2023-08-23 LAB — CMP14+EGFR
ALT: 14 [IU]/L (ref 0–44)
AST: 14 [IU]/L (ref 0–40)
Albumin: 4.1 g/dL (ref 3.9–4.9)
Alkaline Phosphatase: 63 [IU]/L (ref 44–121)
BUN/Creatinine Ratio: 10 (ref 10–24)
BUN: 13 mg/dL (ref 8–27)
Bilirubin Total: 1.3 mg/dL — ABNORMAL HIGH (ref 0.0–1.2)
CO2: 20 mmol/L (ref 20–29)
Calcium: 9.4 mg/dL (ref 8.6–10.2)
Chloride: 108 mmol/L — ABNORMAL HIGH (ref 96–106)
Creatinine, Ser: 1.35 mg/dL — ABNORMAL HIGH (ref 0.76–1.27)
Globulin, Total: 2.9 g/dL (ref 1.5–4.5)
Glucose: 93 mg/dL (ref 70–99)
Potassium: 4.2 mmol/L (ref 3.5–5.2)
Sodium: 142 mmol/L (ref 134–144)
Total Protein: 7 g/dL (ref 6.0–8.5)
eGFR: 57 mL/min/{1.73_m2} — ABNORMAL LOW (ref >59)

## 2023-08-23 LAB — HEMOGLOBIN A1C
Est. average glucose Bld gHb Est-mCnc: 94 mg/dL
Hgb A1c MFr Bld: 4.9 % (ref 4.8–5.6)

## 2023-08-23 LAB — LIPID PANEL
Chol/HDL Ratio: 2.7 {ratio} (ref 0.0–5.0)
Cholesterol, Total: 214 mg/dL — ABNORMAL HIGH (ref 100–199)
HDL: 80 mg/dL (ref >39)
LDL Chol Calc (NIH): 125 mg/dL — ABNORMAL HIGH (ref 0–99)
Triglycerides: 50 mg/dL (ref 0–149)
VLDL Cholesterol Cal: 9 mg/dL (ref 5–40)

## 2023-08-23 LAB — TSH: TSH: 2.06 u[IU]/mL (ref 0.450–4.500)

## 2023-08-23 LAB — PSA: Prostate Specific Ag, Serum: 2.4 ng/mL (ref 0.0–4.0)

## 2023-08-23 MED ORDER — ATORVASTATIN CALCIUM 20 MG PO TABS
20.0000 mg | ORAL_TABLET | Freq: Every day | ORAL | 0 refills | Status: DC
Start: 2023-08-23 — End: 2024-08-06

## 2023-09-11 ENCOUNTER — Ambulatory Visit
Admission: EM | Admit: 2023-09-11 | Discharge: 2023-09-11 | Disposition: A | Discharge status: Home / Self Care | Payer: Medicare Other | Attending: Internal Medicine | Admitting: Internal Medicine

## 2023-09-11 DIAGNOSIS — H1131 Conjunctival hemorrhage, right eye: Secondary | ICD-10-CM | POA: Diagnosis not present

## 2023-09-11 NOTE — ED Triage Notes (Signed)
Pt is here for right eye pain and redness. Denies any recent trauma to his eye.

## 2023-09-11 NOTE — ED Provider Notes (Signed)
[facility]    CSN: [id number] Arrival date & time: 09/11/23  1816      History   Chief Complaint Chief Complaint  Patient presents with   Eye Pain    HPI TAJOHN TALBERT is a 67 y.o. male.   Patient presents with right eye redness that started upon awakening this morning.  Denies any pain, itching, discomfort to the eye.  Denies trauma or foreign body.  Reports he does have a mild blurry vision with far vision which is new upon awakening.  He does not wear glasses or contacts.   Eye Pain    Past Medical History:  Diagnosis Date   Hyperlipidemia     Patient Active Problem List   Diagnosis Date Noted   Hyperlipemia 08/23/2023   Hyperlipidemia 08/16/2021   Chondrocalcinosis of right knee 04/19/2021    Past Surgical History:  Procedure Laterality Date   APPENDECTOMY  2001       Home Medications    Prior to Admission medications   Medication Sig Start Date End Date Taking? Authorizing Provider  atorvastatin (LIPITOR) 20 MG tablet Take 1 tablet (20 mg total) by mouth daily. 08/23/23  Yes Zonia Kief, Amy J, NP  guaiFENesin (MUCINEX PO) Take by mouth.    [provider]  tamsulosin (FLOMAX) 0.4 MG CAPS capsule Take 1 capsule (0.4 mg total) by mouth daily. 04/24/23   Gustavus Bryant, FNP  Turmeric (QC TUMERIC COMPLEX PO) Take by mouth.    [provider]    Family History Family History  Problem Relation Age of Onset   Colon cancer Neg Hx    Esophageal cancer Neg Hx    Stomach cancer Neg Hx     Social History Social History   Tobacco Use   Smoking status: Never    Passive exposure: Never   Smokeless tobacco: Never  Vaping Use   Vaping status: Never Used  Substance Use Topics   Alcohol use: No   Drug use: No     Allergies   Patient has no known allergies.   Review of Systems Review of Systems Per HPI  Physical Exam Triage Vital Signs ED Triage Vitals  Encounter Vitals Group     BP 09/11/23 1935 138/67      Systolic BP Percentile --      Diastolic BP Percentile --      Pulse Rate 09/11/23 1935 76     Resp 09/11/23 1935 16     Temp 09/11/23 1935 98.3 F (36.8 C)     Temp Source 09/11/23 1935 Oral     SpO2 09/11/23 1935 96 %     Weight --      Height --      Head Circumference --      Peak Flow --      Pain Score 09/11/23 1937 4     Pain Loc --      Pain Education --      Exclude from Growth Chart --    No data found.  Updated Vital Signs BP 138/67 (BP Location: Left Arm)   Pulse 76   Temp 98.3 F (36.8 C) (Oral)   Resp 16   SpO2 96%   Visual Acuity Right Eye Distance: 20/25 Left Eye Distance: 20/25 Bilateral Distance: 20/25  Right Eye Near: R Near: 20/25 Left Eye Near:  L Near: 20/25 Bilateral Near:  20/25  Physical Exam Constitutional:      General: He is not in acute distress.  Appearance: Normal appearance. He is not toxic-appearing or diaphoretic.  HENT:     Head: Normocephalic and atraumatic.  Eyes:     General: Lids are normal. Lids are everted, no foreign bodies appreciated. Vision grossly intact. Gaze aligned appropriately.     Extraocular Movements: Extraocular movements intact.     Conjunctiva/sclera: Conjunctivae normal.      Comments: Patient has area of redness present to the right eye at the left inner corner.  Pulmonary:     Effort: Pulmonary effort is normal.  Neurological:     General: No focal deficit present.     Mental Status: He is alert and oriented to person, place, and time. Mental status is at baseline.  Psychiatric:        Mood and Affect: Mood normal.        Behavior: Behavior normal.        Thought Content: Thought content normal.        Judgment: Judgment normal.      UC Treatments / Results  Labs (all labs ordered are listed, but only abnormal results are displayed) Labs Reviewed - No data to display  EKG   Radiology No results found.  Procedures Procedures (including critical care time)  Medications Ordered in  UC Medications - No data to display  Initial Impression / Assessment and Plan / UC Course  I have reviewed the triage vital signs and the nursing notes.  Pertinent labs & imaging results that were available during my care of the patient were reviewed by me and considered in my medical decision making (see chart for details).     Patient's physical exam is consistent with subconjunctival hemorrhage of the right eye.  I did discuss patient's report of mild blurry vision with far vision that started upon awakening this morning with Dr. Allena Katz with on-call ophthalmology.  He advised that he has no further concerns given patient's visual acuity screening is normal.  Discussed with patient that this should reabsorb over the next 1 to 2 weeks but to follow-up with provided contact information for ophthalmology if symptoms persist or worsen.  Patient verbalized understanding and was agreeable with plan. Final Clinical Impressions(s) / UC Diagnoses   Final diagnoses:  Subconjunctival hemorrhage of right eye     Discharge Instructions      You have a subconjunctival hemorrhage which is a ruptured blood vessel. This should completely reabsorb.  If symptoms persist or worsen, please call eye doctor at provided phone number.    ED Prescriptions   None    PDMP not reviewed this encounter.   Gustavus Bryant, [place] 09/12/23 1019

## 2023-09-11 NOTE — Discharge Instructions (Signed)
You have a subconjunctival hemorrhage which is a ruptured blood vessel. This should completely reabsorb.  If symptoms persist or worsen, please call eye doctor at provided phone number.

## 2023-10-04 IMAGING — DX DG FOOT COMPLETE 3+V*L*
3 series · 3 of 3 positions shown · non-contrast
Comparison: No recent.

CLINICAL DATA: History of left foot pain.

EXAM:
LEFT FOOT - COMPLETE 3+ VIEW

[foot ap]
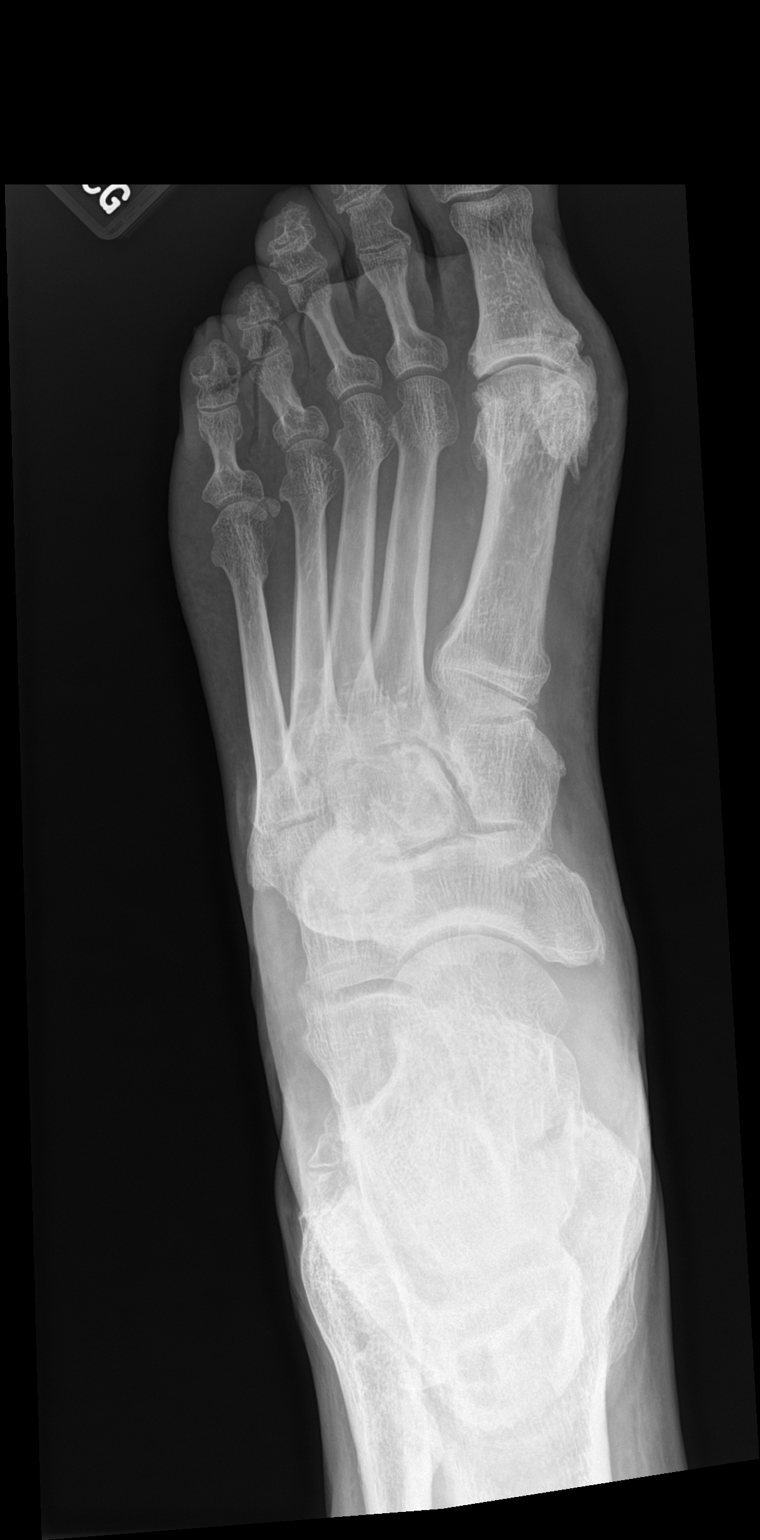

[foot obl]
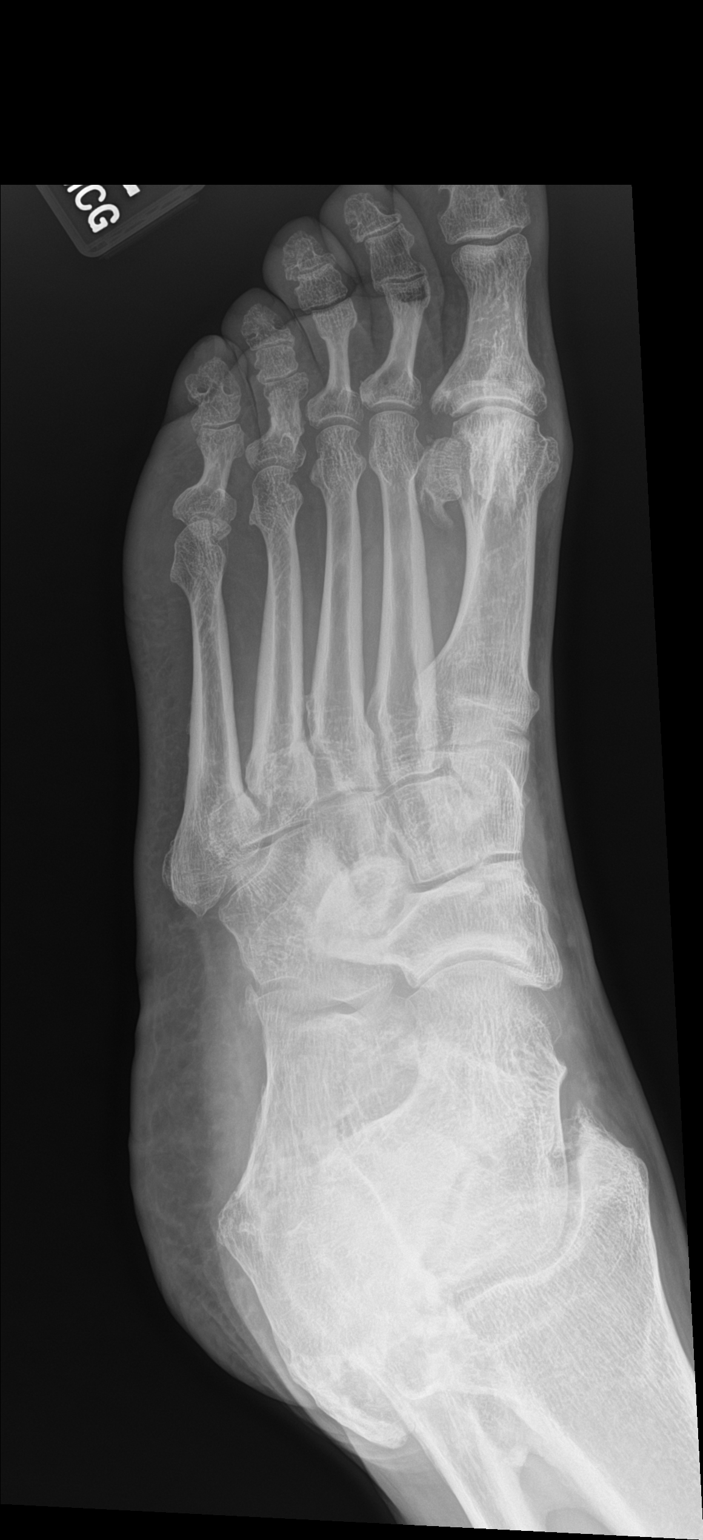

[foot lat]
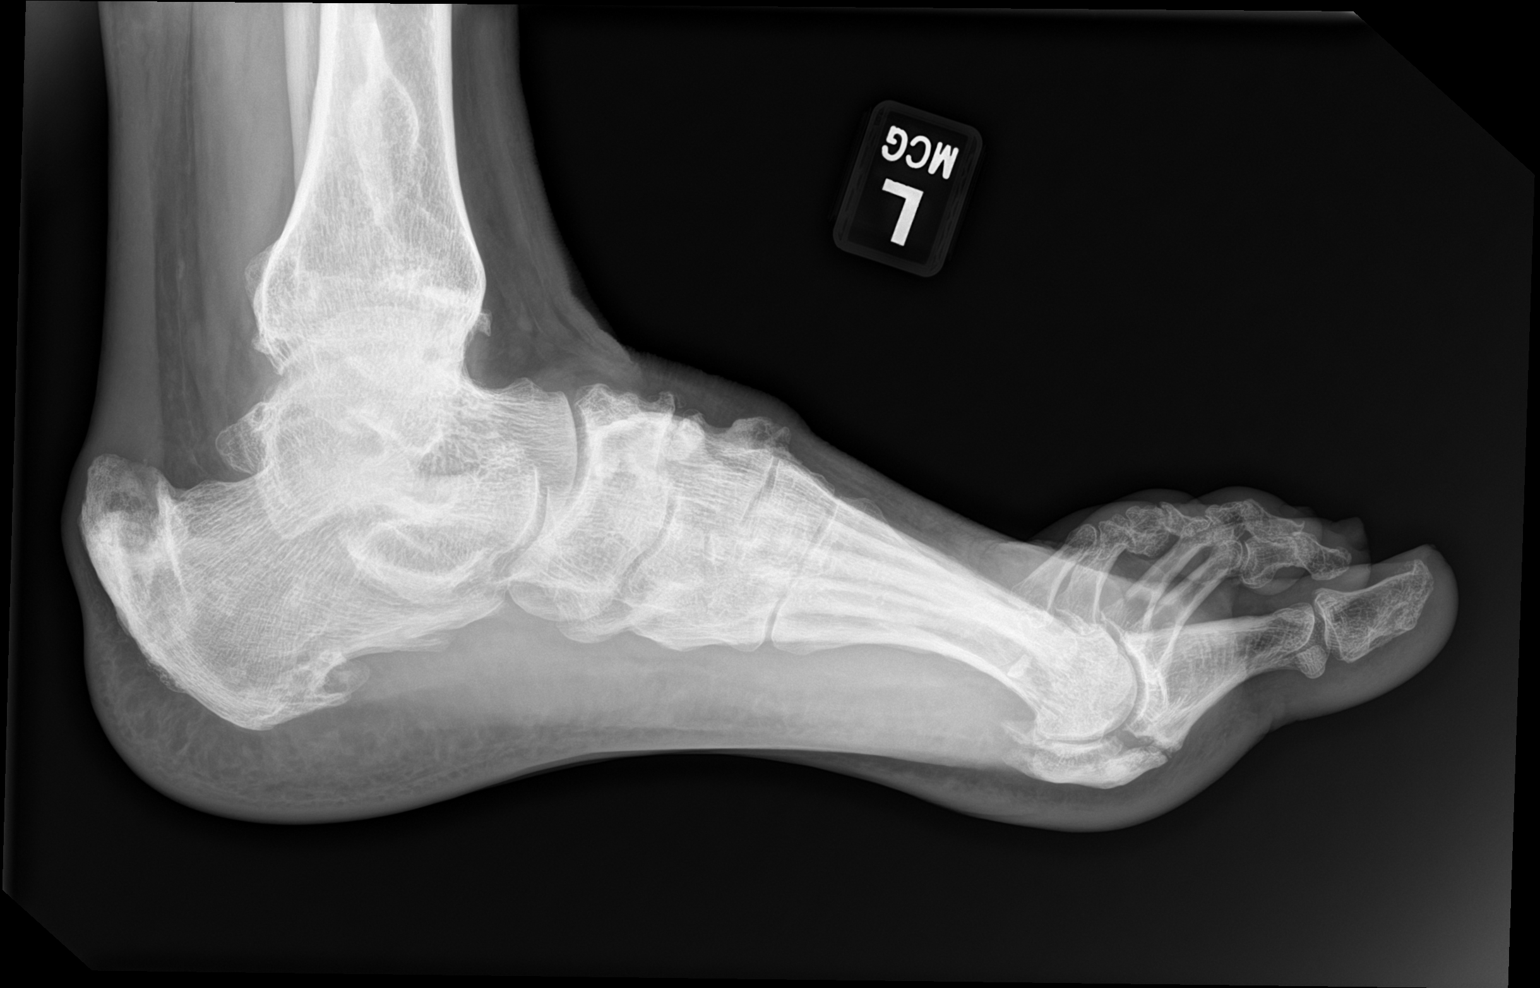

[3 of 3 positions shown; findings below may reference images not displayed]

FINDINGS: Diffuse degenerative change noted about the left foot and ankle.
Degenerative changes are particularly prominent about the left first
metatarsophalangeal joint. Osseous densities along the volar aspect
of the left first metatarsal may be related to prior
injury/degenerative change. Prominent calcaneal spurring. Corticated
bony densities noted adjacent to the lateral malleolus most likely
from old injury. Deformity noted of the distal tibia may be related
to old injury. A fracture of the midportion of the proximal phalanx
of the left fourth digit cannot be excluded, age undetermined. No
radiopaque foreign body.
IMPRESSION: 1. Diffuse degenerative change noted about the left foot and ankle.
Degenerative changes are particularly prominent about the left first
metatarsal phalangeal joint. Osseous densities noted along the volar
aspect left first metatarsal may be related to prior
injury/degenerative change. Prominent calcaneal spurring. Corticated
bony densities noted adjacent to the lateral malleolus most likely
from old injury. Deformity noted of the distal tibia may be related
to old injury.

2. A fracture of the midportion of the left fourth proximal phalanx
cannot be excluded. Age is undetermined.

## 2024-02-22 ENCOUNTER — Ambulatory Visit: Payer: Self-pay

## 2024-02-22 NOTE — Telephone Encounter (Signed)
 Reason for Disposition . NON-URGENT call redirected to PCP's office because it is open  Protocols used: No Contact or Duplicate Contact Call-A-AH

## 2024-07-17 ENCOUNTER — Telehealth: Payer: Self-pay

## 2024-07-17 NOTE — Telephone Encounter (Signed)
 Copied from CRM #[id number]. Topic: Clinical - Request for Lab/Test Order >> Jul 17, 2024  2:23 PM Ivette P wrote: Reason for CRM: Pt called in to see if he about get lab order, pt is wanting to make sure he is ok. He is 79 and wants to know if everything is ok, does not have any symptoms. Pls reach out to pt to schedule when lab order is in.

## 2024-07-25 NOTE — Telephone Encounter (Signed)
 Schedule appointment. Last office visit 08/22/2023.

## 2024-07-25 NOTE — Telephone Encounter (Signed)
 Pt scheduled

## 2024-08-05 ENCOUNTER — Ambulatory Visit (INDEPENDENT_AMBULATORY_CARE_PROVIDER_SITE_OTHER): Admitting: Family

## 2024-08-05 ENCOUNTER — Ambulatory Visit (INDEPENDENT_AMBULATORY_CARE_PROVIDER_SITE_OTHER)

## 2024-08-05 ENCOUNTER — Encounter: Payer: Self-pay | Admitting: Family

## 2024-08-05 VITALS — BP 138/86 | HR 62 | Temp 97.4°F | Resp 16 | Ht 74.0 in | Wt 241.0 lb

## 2024-08-05 DIAGNOSIS — Z131 Encounter for screening for diabetes mellitus: Secondary | ICD-10-CM

## 2024-08-05 DIAGNOSIS — Z130 Encounter for screening for diseases of the blood and blood-forming organs and certain disorders involving the immune mechanism: Secondary | ICD-10-CM

## 2024-08-05 DIAGNOSIS — Z1329 Encounter for screening for other suspected endocrine disorder: Secondary | ICD-10-CM

## 2024-08-05 DIAGNOSIS — Z0000 Encounter for general adult medical examination without abnormal findings: Secondary | ICD-10-CM | POA: Diagnosis not present

## 2024-08-05 DIAGNOSIS — Z13220 Encounter for screening for lipoid disorders: Secondary | ICD-10-CM

## 2024-08-05 DIAGNOSIS — M25562 Pain in left knee: Secondary | ICD-10-CM

## 2024-08-05 DIAGNOSIS — Z13228 Encounter for screening for other metabolic disorders: Secondary | ICD-10-CM

## 2024-08-05 MED ORDER — TRIAMCINOLONE ACETONIDE 40 MG/ML IJ SUSP
40.0000 mg | Freq: Once | INTRAMUSCULAR | Status: AC
Start: 2024-08-05 — End: 2024-08-05
  Administered 2024-08-05: 40 mg via INTRAMUSCULAR

## 2024-08-05 NOTE — Progress Notes (Signed)
 Patient wants some lab work done, left inside knee pain

## 2024-08-05 NOTE — Progress Notes (Signed)
 Patient ID: Clayton Kirby, male    DOB: 01/24/1956  MRN: [id number]  CC: Annual Exam  Subjective: Clayton Kirby is a 68 y.o. male who presents for annual exam.   His concerns today include:  - Up to date on colon cancer screening per [facility].  - Left knee pain for 3 months. Denies recent trauma/injury and red flag symptoms. States left knee pain keeping him awake during nighttime. States he heard pop of left knee.   Patient Active Problem List   Diagnosis Date Noted   Hyperlipemia 08/23/2023   Hyperlipidemia 08/16/2021   Chondrocalcinosis of right knee 04/19/2021     Current Outpatient Medications on File Prior to Visit  Medication Sig Dispense Refill   Turmeric (QC TUMERIC COMPLEX PO) Take by mouth.     atorvastatin  (LIPITOR) 20 MG tablet Take 1 tablet (20 mg total) by mouth daily. (Patient not taking: Reported on 08/05/2024) 90 tablet 0   guaiFENesin (MUCINEX PO) Take by mouth. (Patient not taking: Reported on 08/05/2024)     tamsulosin  (FLOMAX ) 0.4 MG CAPS capsule Take 1 capsule (0.4 mg total) by mouth daily. (Patient not taking: Reported on 08/05/2024) 30 capsule 0   No current facility-administered medications on file prior to visit.    No Known Allergies  Social History   Socioeconomic History   Marital status: Married    Spouse name: Not on file   Number of children: Not on file   Years of education: Not on file   Highest education level: Not on file  Occupational History   Not on file  Tobacco Use   Smoking status: Never    Passive exposure: Never   Smokeless tobacco: Never  Vaping Use   Vaping status: Never Used  Substance and Sexual Activity   Alcohol use: No   Drug use: No   Sexual activity: Not Currently  Other Topics Concern   Not on file  Social History Narrative   Not on file   Social Drivers of Health   Financial Resource Strain: Low Risk  (08/05/2024)   Overall Financial Resource Strain (CARDIA)    Difficulty of Paying Living Expenses:  Not hard at all  Food Insecurity: No Food Insecurity (08/05/2024)   Hunger Vital Sign    Worried About Running Out of Food in the Last Year: Never true    Ran Out of Food in the Last Year: Never true  Transportation Needs: No Transportation Needs (08/05/2024)   PRAPARE - Administrator, Civil Service (Medical): No    Lack of Transportation (Non-Medical): No  Physical Activity: Sufficiently Active (08/05/2024)   Exercise Vital Sign    Days of Exercise per Week: 7 days    Minutes of Exercise per Session: 30 min  Stress: No Stress Concern Present (08/05/2024)   [employer] of Occupational Health - Occupational Stress Questionnaire    Feeling of Stress: Not at all  Social Connections: Socially Integrated (08/05/2024)   Social Connection and Isolation Panel    Frequency of Communication with Friends and Family: More than three times a week    Frequency of Social Gatherings with Friends and Family: More than three times a week    Attends Religious Services: More than 4 times per year    Active Member of [employer] or Organizations: Yes    Attends Banker Meetings: More than 4 times per year    Marital Status: Married  Catering manager Violence: Not At Risk (08/05/2024)  Humiliation, Afraid, Rape, and Kick questionnaire    Fear of Current or Ex-Partner: No    Emotionally Abused: No    Physically Abused: No    Sexually Abused: No    Family History  Problem Relation Age of Onset   Colon cancer Neg Hx    Esophageal cancer Neg Hx    Stomach cancer Neg Hx     Past Surgical History:  Procedure Laterality Date   APPENDECTOMY  2001    ROS: Review of Systems Negative except as stated above  PHYSICAL EXAM: BP 138/86   Pulse 62   Temp (!) 97.4 F (36.3 C) (Oral)   Resp 16   Ht 6' 2 (1.88 m)   Wt 241 lb (109.3 kg)   SpO2 96%   BMI 30.94 kg/m   Physical Exam HENT:     Head: Normocephalic and atraumatic.     Right Ear: Tympanic membrane, ear canal and  external ear normal.     Left Ear: Tympanic membrane, ear canal and external ear normal.     Nose: Nose normal.     Mouth/Throat:     Mouth: Mucous membranes are moist.     Pharynx: Oropharynx is clear.  Eyes:     Extraocular Movements: Extraocular movements intact.     Conjunctiva/sclera: Conjunctivae normal.     Pupils: Pupils are equal, round, and reactive to light.  Neck:     Thyroid : No thyroid  mass, thyromegaly or thyroid  tenderness.  Cardiovascular:     Rate and Rhythm: Normal rate and regular rhythm.     Pulses: Normal pulses.     Heart sounds: Normal heart sounds.  Pulmonary:     Effort: Pulmonary effort is normal.     Breath sounds: Normal breath sounds.  Abdominal:     General: Bowel sounds are normal.     Palpations: Abdomen is soft.  Genitourinary:    Comments: Patient declined. Musculoskeletal:        General: Normal range of motion.     Right shoulder: Normal.     Left shoulder: Normal.     Right upper arm: Normal.     Left upper arm: Normal.     Right elbow: Normal.     Left elbow: Normal.     Right forearm: Normal.     Left forearm: Normal.     Right wrist: Normal.     Left wrist: Normal.     Right hand: Normal.     Left hand: Normal.     Cervical back: Normal, normal range of motion and neck supple.     Thoracic back: Normal.     Lumbar back: Normal.     Right hip: Normal.     Left hip: Normal.     Right upper leg: Normal.     Left upper leg: Normal.     Right knee: Normal.     Left knee: Normal.     Right lower leg: Normal.     Left lower leg: Normal.     Right ankle: Normal.     Left ankle: Normal.     Right foot: Normal.     Left foot: Normal.  Skin:    General: Skin is warm and dry.     Capillary Refill: Capillary refill takes less than 2 seconds.  Neurological:     General: No focal deficit present.     Mental Status: He is alert and oriented to person, place, and time.  Psychiatric:  Mood and Affect: Mood normal.         Behavior: Behavior normal.     ASSESSMENT AND PLAN: 1. Annual physical exam (Primary) - Counseled on 150 minutes of exercise per week as tolerated, healthy eating (including decreased daily intake of saturated fats, cholesterol, added sugars, sodium), STI prevention, and routine healthcare maintenance.  2. Screening for metabolic disorder - Routine screening.  - CMP14+EGFR  3. Screening for deficiency anemia - Routine screening.  - CBC  4. Diabetes mellitus screening - Routine screening.  - Hemoglobin A1c  5. Screening cholesterol level - Routine screening.  - Lipid panel  6. Thyroid  disorder screen - Routine screening.  - TSH  7. Left knee pain, unspecified chronicity - Triamcinolone  Acetonide injection administered.  - Diagnostic Knee Left for evaluation.  - Referral to Orthopedic Surgery for evaluation/management.  - Follow-up with primary provider as scheduled.  - DG Knee Complete 4 Views Left; Future - triamcinolone  acetonide (KENALOG -40) injection 40 mg - Ambulatory referral to Orthopedic Surgery  Patient was given the opportunity to ask questions.  Patient verbalized understanding of the plan and was able to repeat key elements of the plan. Patient was given clear instructions to go to Emergency Department or return to medical center if symptoms don't improve, worsen, or new problems develop.The patient verbalized understanding.   Orders Placed This Encounter  Procedures   DG Knee Complete 4 Views Left   CBC   Lipid panel   CMP14+EGFR   Hemoglobin A1c   TSH   Ambulatory referral to Orthopedic Surgery    Return in 1 year (on 08/05/2025) for Physical per patient preference.  Greig JINNY Drones, NP

## 2024-08-06 ENCOUNTER — Ambulatory Visit: Payer: Self-pay | Admitting: Family

## 2024-08-06 DIAGNOSIS — E785 Hyperlipidemia, unspecified: Secondary | ICD-10-CM

## 2024-08-06 LAB — CMP14+EGFR
ALT: 11 IU/L (ref 0–44)
AST: 17 IU/L (ref 0–40)
Albumin: 4.4 g/dL (ref 3.9–4.9)
Alkaline Phosphatase: 58 IU/L (ref 47–123)
BUN/Creatinine Ratio: 13 (ref 10–24)
BUN: 14 mg/dL (ref 8–27)
Bilirubin Total: 1.7 mg/dL — ABNORMAL HIGH (ref 0.0–1.2)
CO2: 20 mmol/L (ref 20–29)
Calcium: 9.4 mg/dL (ref 8.6–10.2)
Chloride: 106 mmol/L (ref 96–106)
Creatinine, Ser: 1.09 mg/dL (ref 0.76–1.27)
Globulin, Total: 2.5 g/dL (ref 1.5–4.5)
Glucose: 94 mg/dL (ref 70–99)
Potassium: 4 mmol/L (ref 3.5–5.2)
Sodium: 140 mmol/L (ref 134–144)
Total Protein: 6.9 g/dL (ref 6.0–8.5)
eGFR: 73 mL/min/1.73 (ref >59)

## 2024-08-06 LAB — CBC
Hematocrit: 39.7 % (ref 37.5–51.0)
Hemoglobin: 13 g/dL (ref 13.0–17.7)
MCH: 27.5 pg (ref 26.6–33.0)
MCHC: 32.7 g/dL (ref 31.5–35.7)
MCV: 84 fL (ref 79–97)
Platelets: 217 x10E3/uL (ref 150–450)
RBC: 4.73 x10E6/uL (ref 4.14–5.80)
RDW: 12.8 % (ref 11.6–15.4)
WBC: 4.2 x10E3/uL (ref 3.4–10.8)

## 2024-08-06 LAB — HEMOGLOBIN A1C
Est. average glucose Bld gHb Est-mCnc: 88 mg/dL
Hgb A1c MFr Bld: 4.7 % — ABNORMAL LOW (ref 4.8–5.6)

## 2024-08-06 LAB — TSH: TSH: 3.52 u[IU]/mL (ref 0.450–4.500)

## 2024-08-06 LAB — LIPID PANEL
Chol/HDL Ratio: 2.8 ratio (ref 0.0–5.0)
Cholesterol, Total: 213 mg/dL — ABNORMAL HIGH (ref 100–199)
HDL: 76 mg/dL (ref >39)
LDL Chol Calc (NIH): 126 mg/dL — ABNORMAL HIGH (ref 0–99)
Triglycerides: 61 mg/dL (ref 0–149)
VLDL Cholesterol Cal: 11 mg/dL (ref 5–40)

## 2024-08-06 MED ORDER — ATORVASTATIN CALCIUM 20 MG PO TABS: 20.0000 mg | ORAL_TABLET | Freq: Every day | ORAL | 0 refills | Status: DC

## 2024-09-09 ENCOUNTER — Ambulatory Visit: Admitting: Orthopaedic Surgery

## 2024-09-09 ENCOUNTER — Ambulatory Visit

## 2024-09-09 VITALS — Ht 74.0 in | Wt 241.0 lb

## 2024-09-09 DIAGNOSIS — Z0000 Encounter for general adult medical examination without abnormal findings: Secondary | ICD-10-CM

## 2024-09-09 NOTE — Patient Instructions (Signed)
 Clayton Kirby,  Thank you for taking the time for your Medicare Wellness Visit. I appreciate your continued commitment to your health goals. Please review the care plan we discussed, and feel free to reach out if I can assist you further.  Medicare recommends these wellness visits once per year to help you and your care team stay ahead of potential health issues. These visits are designed to focus on prevention, allowing your provider to concentrate on managing your acute and chronic conditions during your regular appointments.  Please note that Annual Wellness Visits do not include a physical exam. Some assessments may be limited, especially if the visit was conducted virtually. If needed, we may recommend a separate in-person follow-up with your provider.  Ongoing Care Seeing your primary care provider every 3 to 6 months helps us  monitor your health and provide consistent, personalized care. Last office visit on 08/05/2024.  Keep up the good work.  Referrals If a referral was made during today's visit and you haven't received any updates within two weeks, please contact the referred provider directly to check on the status.  Recommended Screenings:  Health Maintenance  Topic Date Due   Medicare Annual Wellness Visit  09/09/2025   Colon Cancer Screening  10/25/2031   DTaP/Tdap/Td vaccine (2 - Td or Tdap) 08/23/2032   Hepatitis C Screening  Completed   Meningitis B Vaccine  Aged Out   Pneumococcal Vaccine for age over 79  Discontinued   Flu Shot  Discontinued   COVID-19 Vaccine  Discontinued   Zoster (Shingles) Vaccine  Discontinued       09/09/2024    8:16 AM  Advanced Directives  Does Patient Have a Medical Advance Directive? No   Advance Care Planning is important because it: Ensures you receive medical care that aligns with your values, goals, and preferences. Provides guidance to your family and loved ones, reducing the emotional burden of decision-making during critical  moments.  Vision: Annual vision screenings are recommended for early detection of glaucoma, cataracts, and diabetic retinopathy. These exams can also reveal signs of chronic conditions such as diabetes and high blood pressure.  Dental: Annual dental screenings help detect early signs of oral cancer, gum disease, and other conditions linked to overall health, including heart disease and diabetes.  Please see the attached documents for additional preventive care recommendations.

## 2024-09-09 NOTE — Progress Notes (Signed)
 Subjective:   Clayton Kirby is a 68 y.o. who presents for a Medicare Wellness preventive visit.  As a reminder, Annual Wellness Visits don't include a physical exam, and some assessments may be limited, especially if this visit is performed virtually. We may recommend an in-person follow-up visit with your provider if needed.  Visit Complete: Virtual I connected with  Clayton Kirby on 09/09/24 by a video and audio enabled telemedicine application and verified that I am speaking with the correct person using two identifiers.  Patient Location: Home  Provider Location: Home Office  I discussed the limitations of evaluation and management by telemedicine. The patient expressed understanding and agreed to proceed.  Vital Signs: Because this visit was a virtual/telehealth visit, some criteria may be missing or patient reported. Any vitals not documented were not able to be obtained and vitals that have been documented are patient reported.  Persons Participating in Visit: Patient.  AWV Questionnaire: Yes: Patient Medicare AWV questionnaire was completed by the patient on 09/08/2024; I have confirmed that all information answered by patient is correct and no changes since this date.  Cardiac Risk Factors include: advanced age (>77men, >76 women);dyslipidemia;male gender     Objective:    Today's Vitals   09/09/24 9187  Weight: 241 lb (109.3 kg)  Height: 6' 2 (1.88 m)   Body mass index is 30.94 kg/m.     09/09/2024    8:16 AM  Advanced Directives  Does Patient Have a Medical Advance Directive? No    Current Medications (verified) Outpatient Encounter Medications as of 09/09/2024  Medication Sig   atorvastatin  (LIPITOR) 20 MG tablet Take 1 tablet (20 mg total) by mouth daily.   Turmeric (QC TUMERIC COMPLEX PO) Take by mouth.   guaiFENesin (MUCINEX PO) Take by mouth. (Patient not taking: Reported on 08/05/2024)   tamsulosin  (FLOMAX ) 0.4 MG CAPS capsule Take 1 capsule (0.4 mg  total) by mouth daily. (Patient not taking: Reported on 08/05/2024)   No facility-administered encounter medications on file as of 09/09/2024.    Allergies (verified) Patient has no known allergies.   History: Past Medical History:  Diagnosis Date   Hyperlipidemia    Past Surgical History:  Procedure Laterality Date   APPENDECTOMY  2001   Family History  Problem Relation Age of Onset   Colon cancer Neg Hx    Esophageal cancer Neg Hx    Stomach cancer Neg Hx    Social History   Socioeconomic History   Marital status: Married    Spouse name: Kathlen   Number of children: Not on file   Years of education: Not on file   Highest education level: 12th grade  Occupational History   Not on file  Tobacco Use   Smoking status: Never    Passive exposure: Never   Smokeless tobacco: Never  Vaping Use   Vaping status: Never Used  Substance and Sexual Activity   Alcohol use: No   Drug use: No   Sexual activity: Not Currently  Other Topics Concern   Not on file  Social History Narrative   Lives with wife and 2 grand children live with them/2025   Social Drivers of Health   Financial Resource Strain: Low Risk  (09/08/2024)   Overall Financial Resource Strain (CARDIA)    Difficulty of Paying Living Expenses: Not hard at all  Food Insecurity: No Food Insecurity (09/08/2024)   Hunger Vital Sign    Worried About Running Out of Food in the Last  Year: Never true    Ran Out of Food in the Last Year: Never true  Transportation Needs: No Transportation Needs (09/08/2024)   PRAPARE - Administrator, Civil Service (Medical): No    Lack of Transportation (Non-Medical): No  Physical Activity: Sufficiently Active (09/08/2024)   Exercise Vital Sign    Days of Exercise per Week: 6 days    Minutes of Exercise per Session: 150+ min  Stress: Stress Concern Present (09/08/2024)   [employer] of Occupational Health - Occupational Stress Questionnaire    Feeling of Stress:  Rather much  Social Connections: Socially Integrated (09/08/2024)   Social Connection and Isolation Panel    Frequency of Communication with Friends and Family: More than three times a week    Frequency of Social Gatherings with Friends and Family: More than three times a week    Attends Religious Services: More than 4 times per year    Active Member of [employer] or Organizations: Yes    Attends Engineer, Structural: More than 4 times per year    Marital Status: Married    Tobacco Counseling Counseling given: Not Answered    Clinical Intake:  Pre-visit preparation completed: Yes  Pain : No/denies pain     BMI - recorded: 30.94 Nutritional Status: BMI > 30  Obese Nutritional Risks: None Diabetes: No  Lab Results  Component Value Date   HGBA1C 4.7 (L) 08/05/2024   HGBA1C 4.9 08/22/2023   HGBA1C 4.8 08/23/2022     How often do you need to have someone help you when you read instructions, pamphlets, or other written materials from your doctor or pharmacy?: 1 - Never  Interpreter Needed?: No  Information entered by :: Alli Jasmer, RMA   Activities of Daily Living     09/08/2024    9:20 AM  In your present state of health, do you have any difficulty performing the following activities:  Hearing? 0  Vision? 0  Difficulty concentrating or making decisions? 0  Walking or climbing stairs? 0  Dressing or bathing? 0  Doing errands, shopping? 0  Preparing Food and eating ? N  Using the Toilet? N  In the past six months, have you accidently leaked urine? N  Do you have problems with loss of bowel control? N  Managing your Medications? N  Managing your Finances? N  Housekeeping or managing your Housekeeping? N    Patient Care Team: Lorren Greig PARAS, NP as PCP - General (Nurse Practitioner)  I have updated your Care Teams any recent Medical Services you may have received from other providers in the past year.     Assessment:   This is a routine wellness  examination for [facility].  Hearing/Vision screen Hearing Screening - Comments:: Denies hearing difficulties   Vision Screening - Comments:: Denies vision issues./no eye care provider    Goals Addressed   None    Depression Screen     09/09/2024    8:20 AM 08/22/2023   10:10 AM 08/23/2022    9:11 AM 08/15/2021    4:03 PM  PHQ 2/9 Scores  PHQ - 2 Score 0 0 0 0  PHQ- 9 Score 0       Fall Risk     09/08/2024    9:20 AM 08/05/2024    8:24 AM 08/22/2023   10:10 AM 08/23/2022    9:10 AM 08/15/2021    4:03 PM  Fall Risk   Falls in the past year? 0 1 0  0 0  Number falls in past yr: 0 1 0 0 0  Injury with Fall? 0 0 0 0 0  Risk for fall due to :  No Fall Risks;Other (Comment) No Fall Risks No Fall Risks No Fall Risks  Risk for fall due to: Comment  patient bowls and falls sometimes     Follow up Falls evaluation completed;Falls prevention discussed Falls evaluation completed  Falls evaluation completed  Falls evaluation completed      Data saved with a previous flowsheet row definition    MEDICARE RISK AT HOME:  Medicare Risk at Home Any stairs in or around the home?: (Patient-Rptd) Yes If so, are there any without handrails?: (Patient-Rptd) No Home free of loose throw rugs in walkways, pet beds, electrical cords, etc?: (Patient-Rptd) No Adequate lighting in your home to reduce risk of falls?: (Patient-Rptd) Yes Life alert?: (Patient-Rptd) No Use of a cane, walker or w/c?: (Patient-Rptd) No Grab bars in the bathroom?: (Patient-Rptd) No Shower chair or bench in shower?: (Patient-Rptd) No Elevated toilet seat or a handicapped toilet?: (Patient-Rptd) No  TIMED UP AND GO:  Was the test performed?  No  Cognitive Function: 6CIT completed        09/09/2024    8:17 AM  6CIT Screen  What Year? 0 points  What month? 0 points  What time? 0 points  Months in reverse 0 points  Repeat phrase 2 points    Immunizations Immunization History  Administered Date(s) Administered    Fluad Quad(high Dose 65+) 07/24/2022   Janssen (J&J) SARS-COV-2 Vaccination 02/07/2020   PFIZER(Purple Top)SARS-COV-2 Vaccination 11/16/2020   Tdap 08/23/2022    Screening Tests Health Maintenance  Topic Date Due   Medicare Annual Wellness (AWV)  09/09/2025   Colonoscopy  10/25/2031   DTaP/Tdap/Td (2 - Td or Tdap) 08/23/2032   Hepatitis C Screening  Completed   Meningococcal B Vaccine  Aged Out   Pneumococcal Vaccine: 50+ Years  Discontinued   Influenza Vaccine  Discontinued   COVID-19 Vaccine  Discontinued   Zoster Vaccines- Shingrix  Discontinued    Health Maintenance Items Addressed: See Nurse Notes at the end of this note  Additional Screening:  Vision Screening: Recommended annual ophthalmology exams for early detection of glaucoma and other disorders of the eye. Is the patient up to date with their annual eye exam?  No  Who is the provider or what is the name of the office in which the patient attends annual eye exams? Per pt does not have a provider for eye care. He is not up to date.  Dental Screening: Recommended annual dental exams for proper oral hygiene  Community Resource Referral / Chronic Care Management: CRR required this visit?  No   CCM required this visit?  No   Plan:    I have personally reviewed and noted the following in the patient's chart:   Medical and social history Use of alcohol, tobacco or illicit drugs  Current medications and supplements including opioid prescriptions. Patient is not currently taking opioid prescriptions. Functional ability and status Nutritional status Physical activity Advanced directives List of other physicians Hospitalizations, surgeries, and ER visits in previous 12 months Vitals Screenings to include cognitive, depression, and falls Referrals and appointments  In addition, I have reviewed and discussed with patient certain preventive protocols, quality metrics, and best practice recommendations. A written  personalized care plan for preventive services as well as general preventive health recommendations were provided to patient.   Tyree Fluharty L Jahmiya Guidotti, CMA  09/09/2024   After Visit Summary: (MyChart) Due to this being a telephonic visit, the after visit summary with patients personalized plan was offered to patient via MyChart   Notes: Patient declines any vaccines due.  He stated that he would like to come in office to get his cholesterol checked to see if the numbers have changed.  I informed patient that he can call the office and schedule a visit x 6months from last office visit.  Patient will schedule for a office visit for March 2026.  Patient is up to date on all health maintenance with no concerns to address today.

## 2024-09-15 ENCOUNTER — Encounter: Payer: Self-pay | Admitting: Radiology

## 2024-11-03 ENCOUNTER — Other Ambulatory Visit: Payer: Self-pay | Admitting: Family

## 2024-11-03 DIAGNOSIS — E785 Hyperlipidemia, unspecified: Secondary | ICD-10-CM

## 2025-08-05 ENCOUNTER — Encounter: Admitting: Family
